# Patient Record
Sex: Female | Born: 1975 | Hispanic: Yes | Smoking: Never smoker
Health system: Southern US, Community
[De-identification: ages and names within clinical notes are randomized; demographics above are authoritative.]

---

## 2004-12-22 ENCOUNTER — Emergency Department: Payer: Self-pay | Admitting: Emergency Medicine

## 2005-04-08 ENCOUNTER — Ambulatory Visit: Payer: Self-pay

## 2005-09-27 ENCOUNTER — Emergency Department: Payer: Self-pay | Admitting: Emergency Medicine

## 2006-02-05 ENCOUNTER — Emergency Department: Payer: Self-pay | Admitting: General Practice

## 2006-02-06 ENCOUNTER — Ambulatory Visit: Payer: Self-pay | Admitting: General Practice

## 2006-02-06 ENCOUNTER — Observation Stay: Payer: Self-pay | Admitting: Surgery

## 2006-02-11 ENCOUNTER — Emergency Department: Payer: Self-pay | Admitting: Emergency Medicine

## 2007-06-02 IMAGING — US ABDOMEN ULTRASOUND
1 series · 17 of 25 positions shown · non-contrast
Comparison: none

REASON FOR EXAM: RIGHT upper quadrant pain
COMMENTS:

[Series 1: abdomen ultrasound · 17 of 51 slices shown]
[im 1/51]
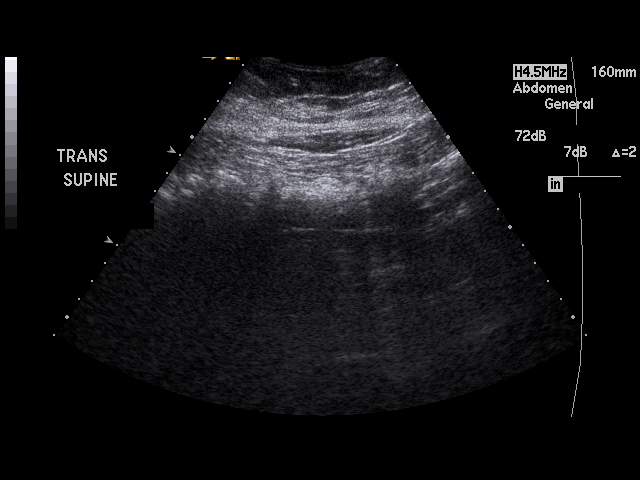
[im 5/51]
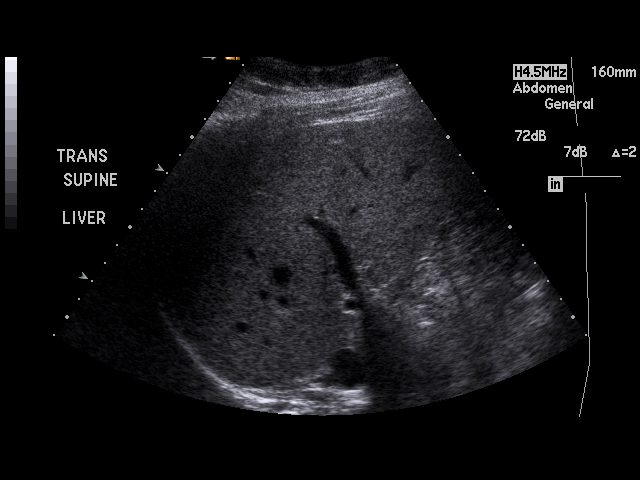
[im 7/51]
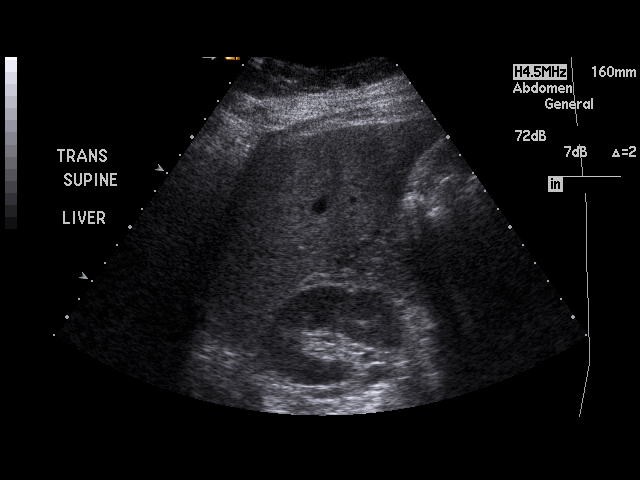
[im 11/51]
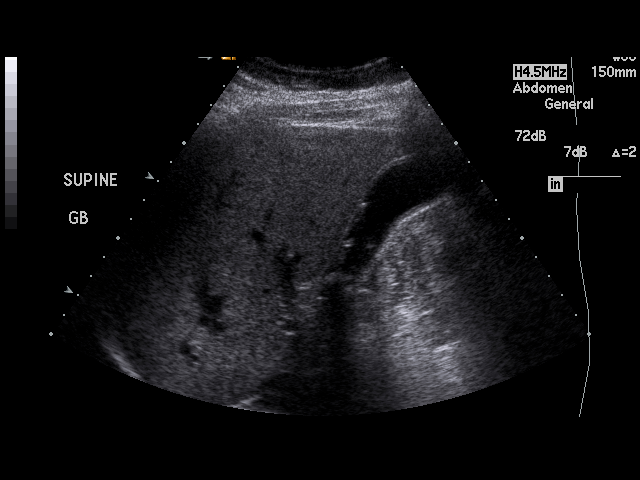
[im 13/51]
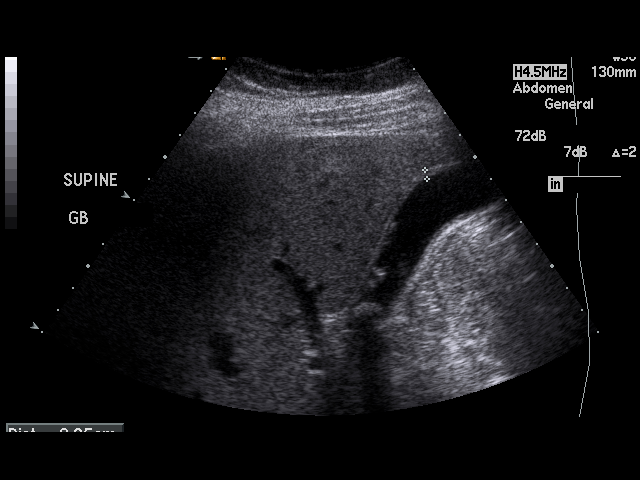
[im 17/51]
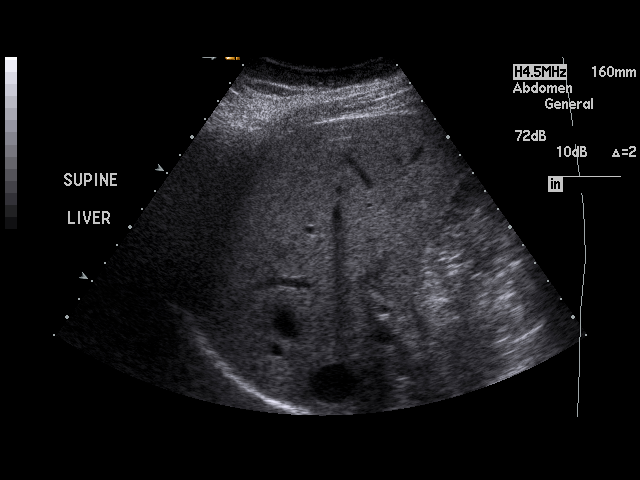
[im 19/51]
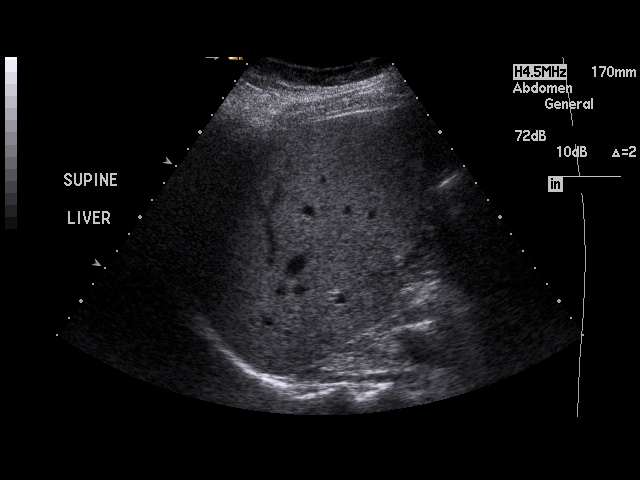
[im 23/51]
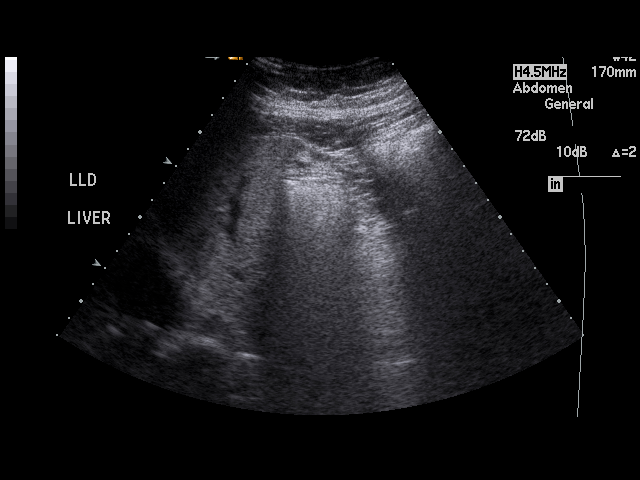
[im 26/51]
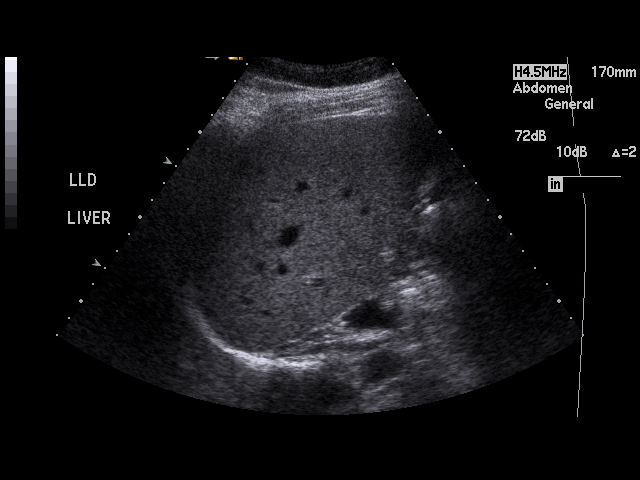
[im 28/51]
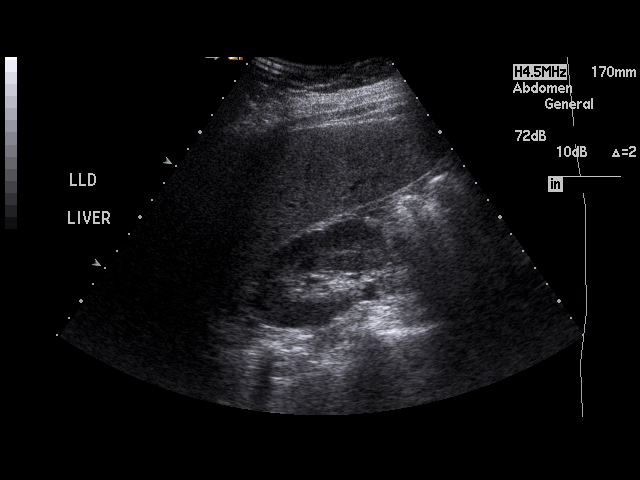
[im 32/51]
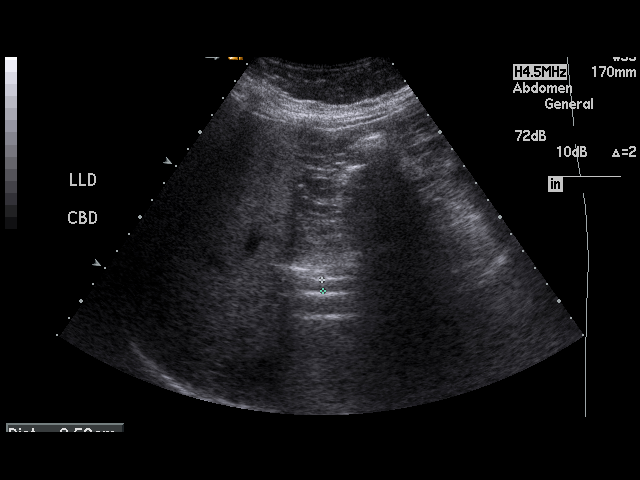
[im 34/51]
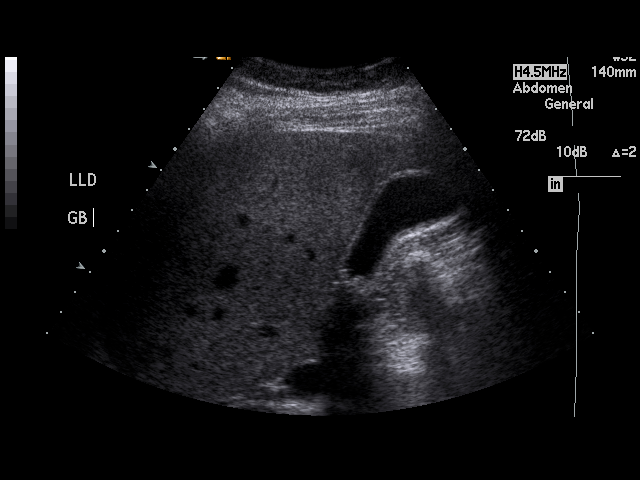
[im 38/51]
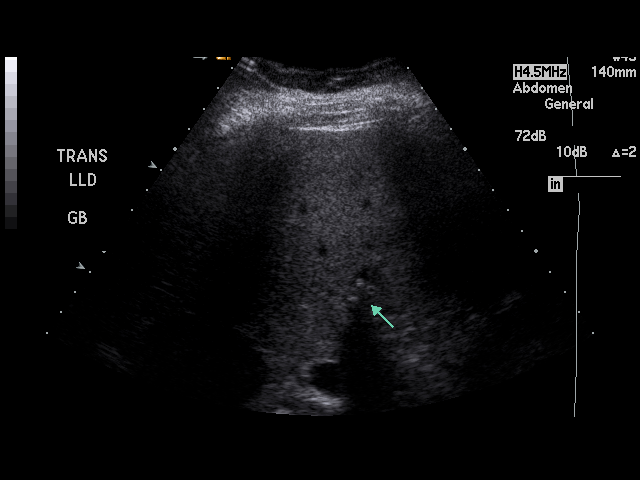
[im 40/51]
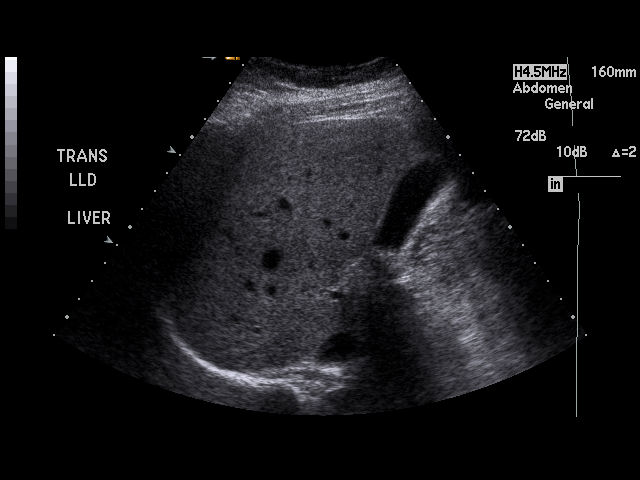
[im 44/51]
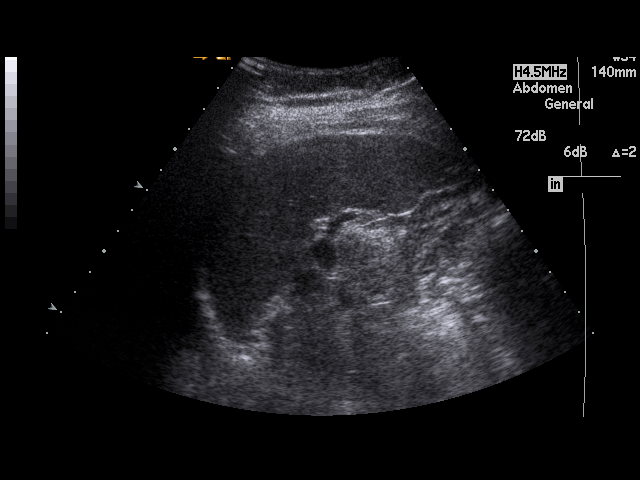
[im 46/51]
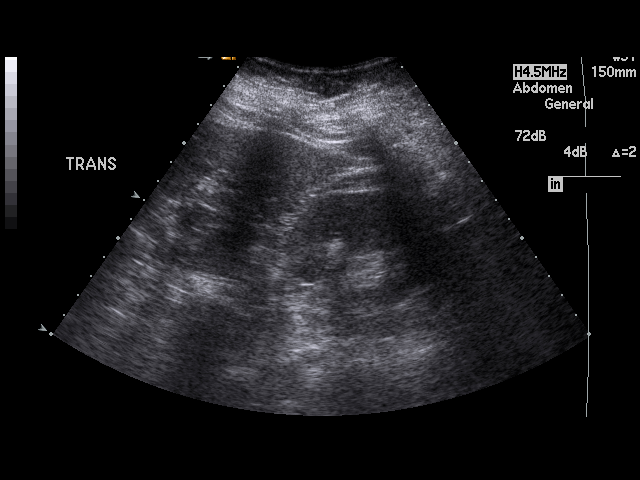
[im 51/51]
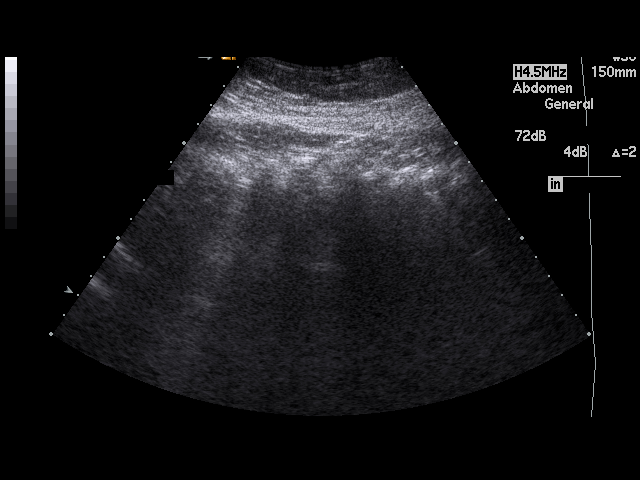

[17 of 25 positions shown; findings below may reference images not displayed]

PROCEDURE:     US  - US ABDOMEN GENERAL SURVEY  - February 06, 2006  [DATE]

RESULT:       Sonographic evaluation of the abdomen is performed.  The
pancreas could not be seen because of overlying bowel gas.  The liver,
aorta, spleen and kidneys appear to be normal.  Gallbladder contains
gallstones which appear to be mobile but there is a stone within the neck of
the gallbladder which did not move.  There is a positive sonographic Murphy
sign.  Gallbladder wall thickness is increased at 3.5 mm and the common bile
duct diameter is 6.4 mm.
IMPRESSION: Sonographic findings of cholelithiasis with what appears to be an impacted
stone in the neck of the gallbladder and possible changes of cholecystitis.

## 2010-02-19 ENCOUNTER — Emergency Department: Payer: Self-pay | Admitting: Emergency Medicine

## 2013-07-26 ENCOUNTER — Emergency Department: Payer: Self-pay | Admitting: Emergency Medicine

## 2013-11-21 ENCOUNTER — Emergency Department: Payer: Self-pay | Admitting: Emergency Medicine

## 2013-11-22 LAB — COMPREHENSIVE METABOLIC PANEL
Albumin: 4 g/dL (ref 3.4–5.0)
Alkaline Phosphatase: 76 U/L
Anion Gap: 4 — ABNORMAL LOW (ref 7–16)
BUN: 11 mg/dL (ref 7–18)
Bilirubin,Total: 0.3 mg/dL (ref 0.2–1.0)
Calcium, Total: 9.1 mg/dL (ref 8.5–10.1)
Chloride: 101 mmol/L (ref 98–107)
Co2: 31 mmol/L (ref 21–32)
Creatinine: 0.57 mg/dL — ABNORMAL LOW (ref 0.60–1.30)
EGFR (African American): >60
EGFR (Non-African Amer.): >60
Glucose: 89 mg/dL (ref 65–99)
Osmolality: 271 (ref 275–301)
Potassium: 3.1 mmol/L — ABNORMAL LOW (ref 3.5–5.1)
SGOT(AST): 10 U/L — ABNORMAL LOW (ref 15–37)
SGPT (ALT): 21 U/L (ref 12–78)
Sodium: 136 mmol/L (ref 136–145)
Total Protein: 8.6 g/dL — ABNORMAL HIGH (ref 6.4–8.2)

## 2013-11-22 LAB — CBC
HCT: 37.5 % (ref 35.0–47.0)
HGB: 12.6 g/dL (ref 12.0–16.0)
MCH: 28.7 pg (ref 26.0–34.0)
MCHC: 33.6 g/dL (ref 32.0–36.0)
MCV: 85 fL (ref 80–100)
Platelet: 291 10*3/uL (ref 150–440)
RBC: 4.41 10*6/uL (ref 3.80–5.20)
RDW: 14 % (ref 11.5–14.5)
WBC: 7.6 10*3/uL (ref 3.6–11.0)

## 2013-11-22 LAB — TROPONIN I: Troponin-I: <0.02 ng/mL

## 2013-11-22 LAB — MAGNESIUM: Magnesium: 1.8 mg/dL

## 2013-11-22 LAB — LIPASE, BLOOD: Lipase: 133 U/L (ref 73–393)

## 2013-11-22 LAB — TSH: Thyroid Stimulating Horm: 5.81 u[IU]/mL — ABNORMAL HIGH

## 2014-11-19 IMAGING — CR RIGHT ELBOW - COMPLETE 3+ VIEW
1 series · 4 of 4 positions shown · non-contrast
Comparison: No priors.

CLINICAL DATA: History of fall complaining right elbow pain.

EXAM:
RIGHT ELBOW - COMPLETE 3+ VIEW

[Series 1: x elbow lat right · 0.14mm/px · 4 of 4 slices shown]
[im 1/4]
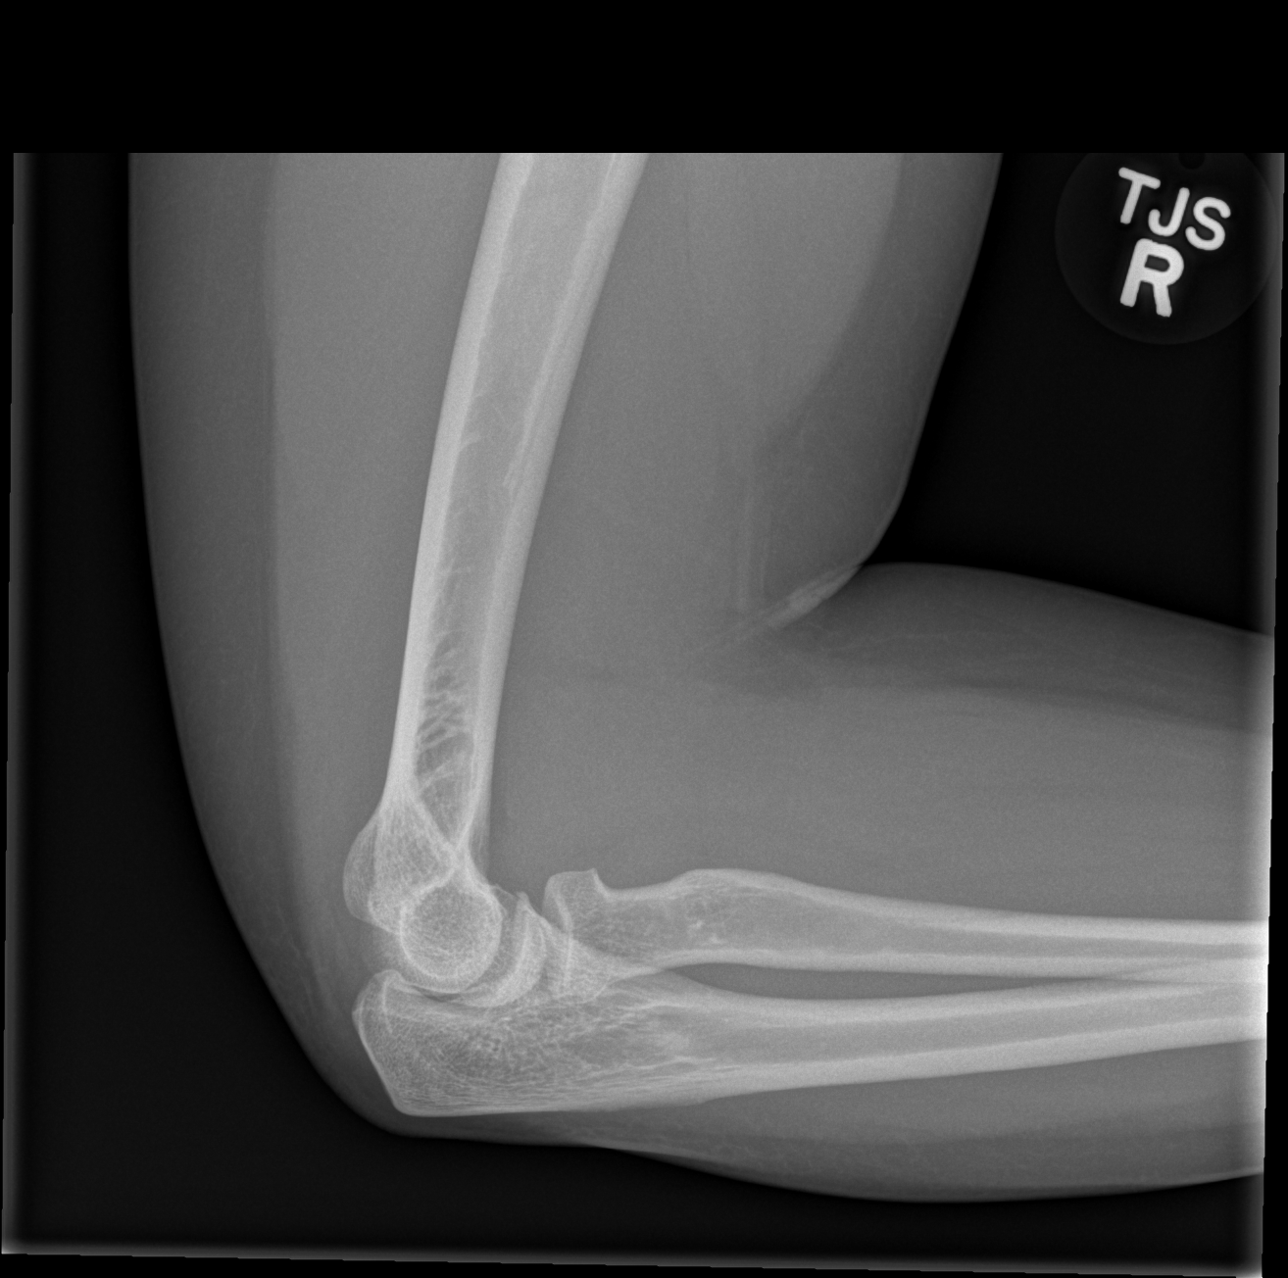
[im 2/4]
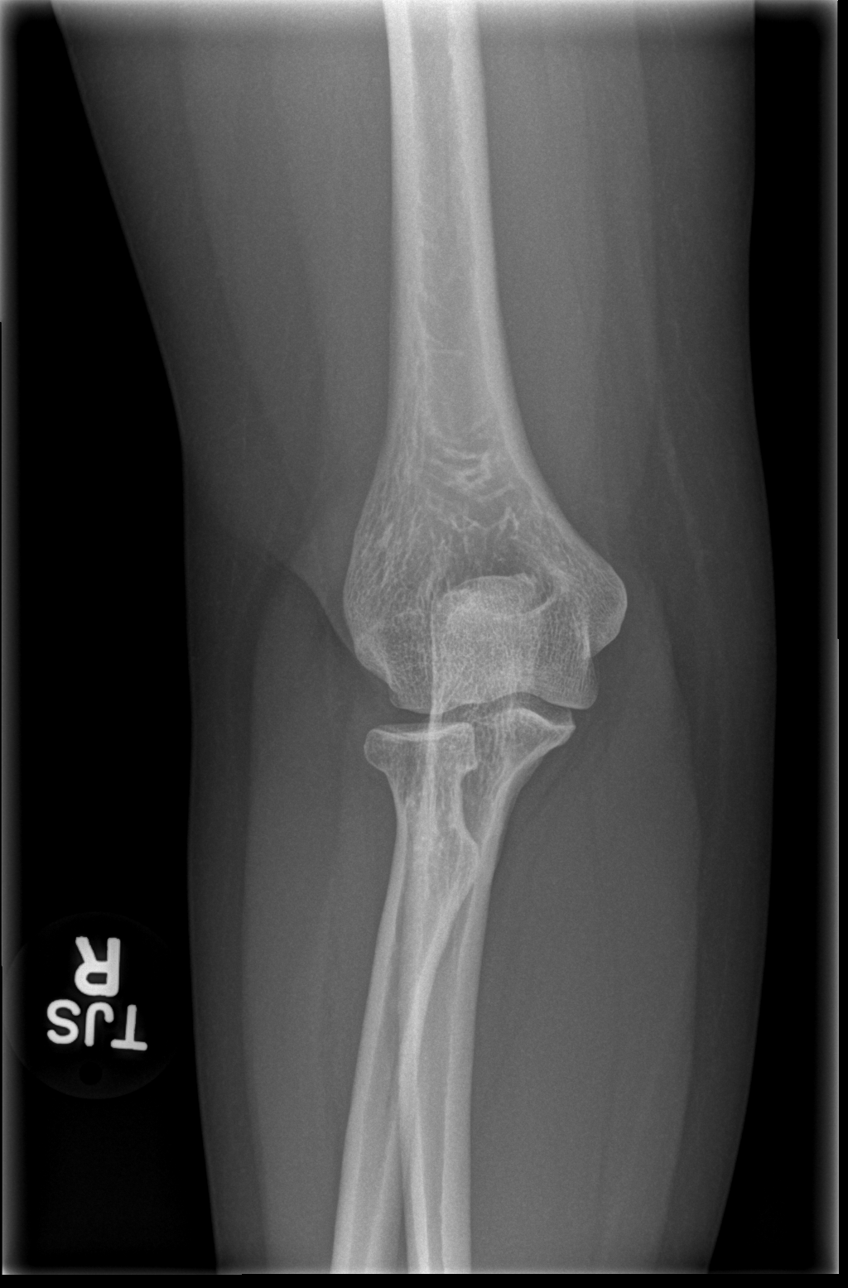
[im 3/4]
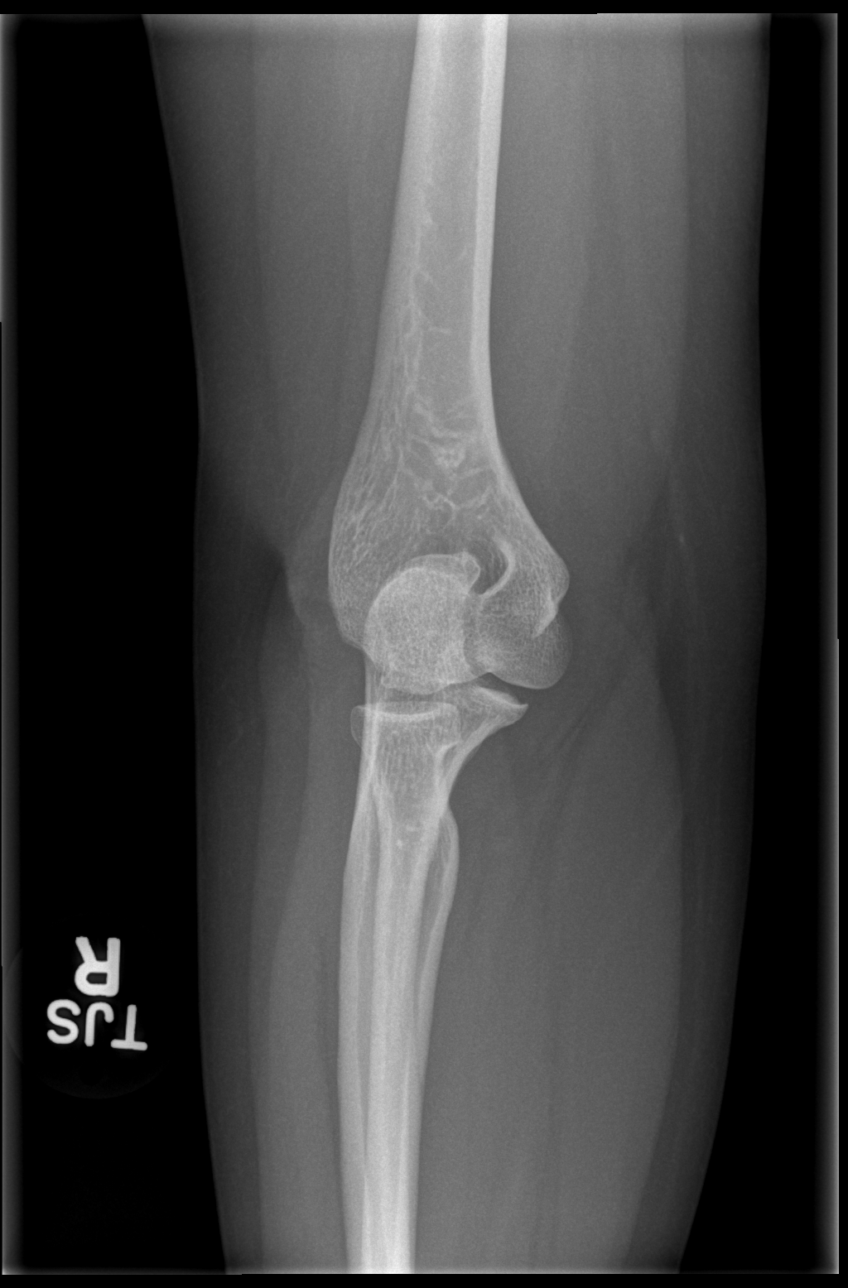
[im 4/4]
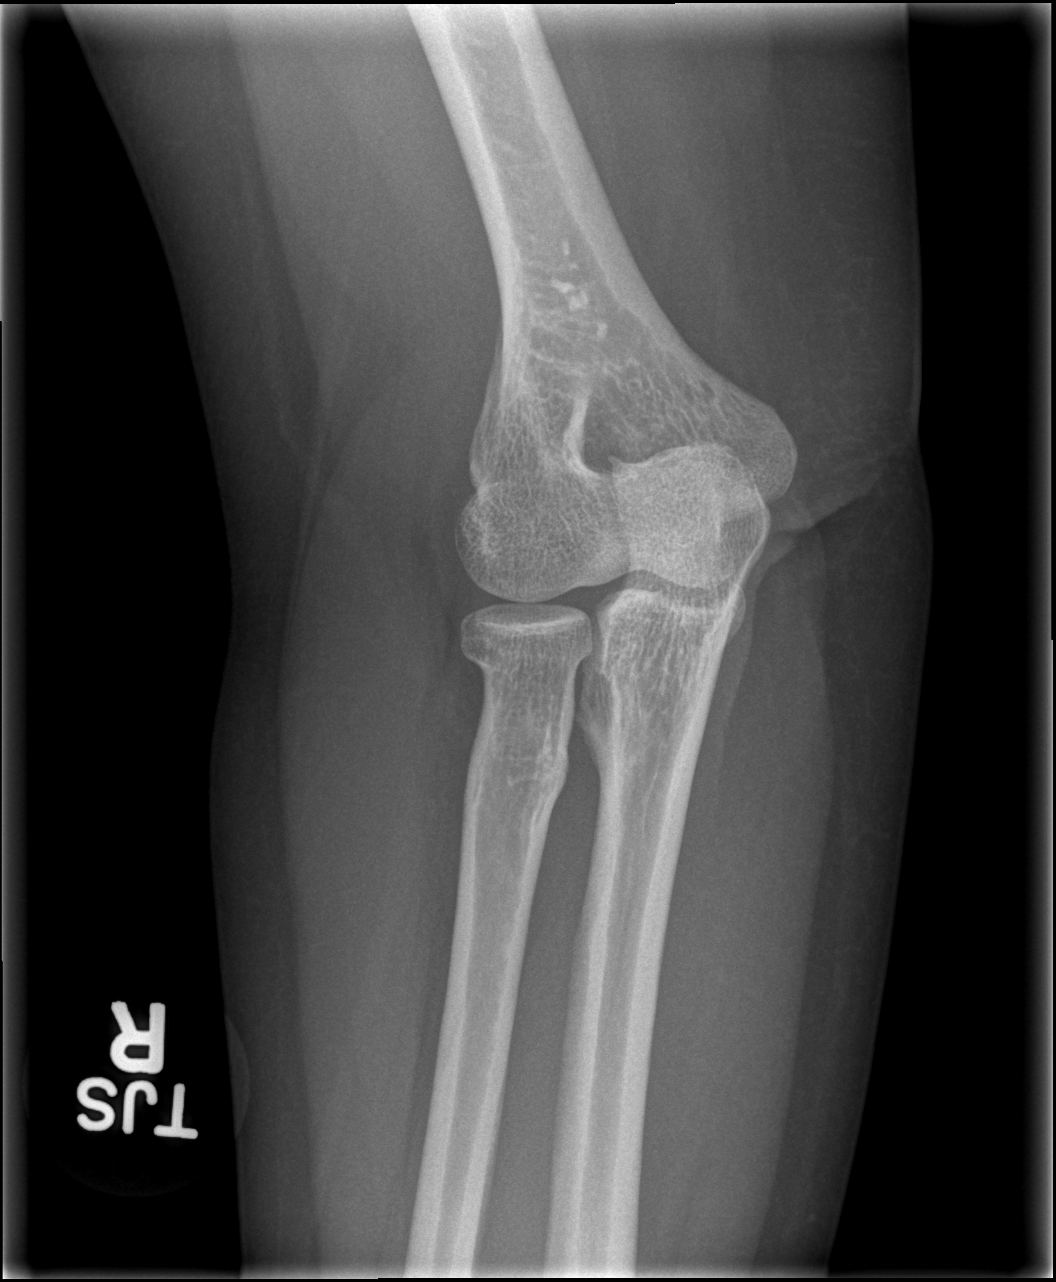

[4 of 4 positions shown; findings below may reference images not displayed]

FINDINGS: Four views of the right elbow demonstrate no acute displaced
fracture, subluxation, dislocation, joint or soft tissue
abnormality.
IMPRESSION: 1. Acute radiographic abnormality of the right elbow.

## 2015-06-19 ENCOUNTER — Emergency Department
Admission: EM | Admit: 2015-06-19 | Discharge: 2015-06-20 | Disposition: A | Discharge status: Home / Self Care | Payer: Self-pay | Attending: Emergency Medicine | Admitting: Emergency Medicine

## 2015-06-19 ENCOUNTER — Encounter: Payer: Self-pay | Admitting: Emergency Medicine

## 2015-06-19 DIAGNOSIS — J189 Pneumonia, unspecified organism: Secondary | ICD-10-CM

## 2015-06-19 DIAGNOSIS — J159 Unspecified bacterial pneumonia: Secondary | ICD-10-CM | POA: Insufficient documentation

## 2015-06-19 DIAGNOSIS — Z880 Allergy status to penicillin: Secondary | ICD-10-CM | POA: Insufficient documentation

## 2015-06-19 LAB — BASIC METABOLIC PANEL
Anion gap: 10 (ref 5–15)
BUN: 7 mg/dL (ref 6–20)
CO2: 23 mmol/L (ref 22–32)
Calcium: 9.3 mg/dL (ref 8.9–10.3)
Chloride: 100 mmol/L — ABNORMAL LOW (ref 101–111)
Creatinine, Ser: 0.64 mg/dL (ref 0.44–1.00)
GFR calc Af Amer: >60 mL/min (ref >60)
GFR calc non Af Amer: >60 mL/min (ref >60)
Glucose, Bld: 108 mg/dL — ABNORMAL HIGH (ref 65–99)
Potassium: 3.7 mmol/L (ref 3.5–5.1)
Sodium: 133 mmol/L — ABNORMAL LOW (ref 135–145)

## 2015-06-19 LAB — CBC
HCT: 35.1 % (ref 35.0–47.0)
Hemoglobin: 11.6 g/dL — ABNORMAL LOW (ref 12.0–16.0)
MCH: 25.9 pg — ABNORMAL LOW (ref 26.0–34.0)
MCHC: 33 g/dL (ref 32.0–36.0)
MCV: 78.5 fL — ABNORMAL LOW (ref 80.0–100.0)
Platelets: 261 10*3/uL (ref 150–440)
RBC: 4.47 MIL/uL (ref 3.80–5.20)
RDW: 15 % — ABNORMAL HIGH (ref 11.5–14.5)
WBC: 6.7 10*3/uL (ref 3.6–11.0)

## 2015-06-19 MED ORDER — ACETAMINOPHEN 500 MG PO TABS
1000.0000 mg | ORAL_TABLET | Freq: Once | ORAL | Status: AC
  Administered 2015-06-19: 1000 mg via ORAL
  Filled 2015-06-19: qty 2

## 2015-06-19 MED ORDER — SODIUM CHLORIDE 0.9 % IV BOLUS (SEPSIS)
1000.0000 mL | Freq: Once | INTRAVENOUS | Status: AC
Start: 2015-06-19 — End: 2015-06-20
  Administered 2015-06-19: 1000 mL via INTRAVENOUS

## 2015-06-19 NOTE — ED Notes (Signed)
Pt to triage via w/c with no distress noted, wrapped up in blankets; pt reports fever, body aches and nonprod cough today; st recent cold symptoms; tylenol taken at 215pm; pt instructed not to bundle up with fever and blankets removed

## 2015-06-20 ENCOUNTER — Emergency Department: Payer: Self-pay

## 2015-06-20 ENCOUNTER — Other Ambulatory Visit: Payer: Self-pay

## 2015-06-20 MED ORDER — LEVOFLOXACIN IN D5W 750 MG/150ML IV SOLN
750.0000 mg | Freq: Once | INTRAVENOUS | Status: AC
  Administered 2015-06-20: 750 mg via INTRAVENOUS
  Filled 2015-06-20: qty 150

## 2015-06-20 MED ORDER — SODIUM CHLORIDE 0.9 % IV BOLUS (SEPSIS)
1000.0000 mL | Freq: Once | INTRAVENOUS | Status: AC
  Administered 2015-06-20: 1000 mL via INTRAVENOUS

## 2015-06-20 MED ORDER — LEVOFLOXACIN 750 MG PO TABS: 750.0000 mg | ORAL_TABLET | Freq: Every day | ORAL | Status: AC

## 2015-06-20 NOTE — Discharge Instructions (Signed)
Neumona (Pneumonia) La neumona es una infeccin en los pulmones.  CAUSAS La neumona puede estar causada por una bacteria o un virus. Generalmente, estas infecciones estn causadas por la aspiracin de partculas infecciosas que ingresan a los pulmones (vas respiratorias). SIGNOS Y SNTOMAS   Tos.  Fiebre.  Dolor en el pecho.  Frecuencia respiratoria aumentada.  Sibilancias.  Produccin de mucosidad. DIAGNSTICO  Si presenta los sntomas comunes de la neumona, el mdico normalmente confirmar el diagnstico con una radiografa de trax. Si tiene neumona, la radiografa mostrar una anomala en el pulmn (infiltrados pulmonares). Podrn realizarse otras pruebas de sangre, orina o esputo para encontrar la causa especfica de su neumona. El mdico tambin puede hacer pruebas (como gases en sangre o una oximetra de pulso) para verificar el correcto funcionamiento de los pulmones. TRATAMIENTO  Algunos tipos de neumona pueden contagiarse a otras personas al toser o estornudar. Es posible que le pidan que utilice una mscara antes y durante el examen. Si la neumona est causada por una bacteria, puede tratarse con medicamentos antibiticos. Si la neumona es causada por el virus de la gripe, puede tratarse con medicamentos antivirales. La mayora de las dems infecciones virales deben seguir su curso. Estas infecciones no respondern a los antibiticos.  INSTRUCCIONES PARA EL CUIDADO EN EL HOGAR   Los inhibidores de la tos pueden utilizarse si no descansa bien. Sin embargo, la tos, al limpiar los pulmones, brinda una proteccin. Debe evitar, en lo posible, utilizar medicamentos para detener la tos.  Es posible que el mdico le haya recetado medicamentos si cree que la causa de su neumona es una bacteria o gripe. Finalice los medicamentos, aunque comience a sentirse mejor.  El mdico tambin puede haberle recetado un expectorante. Este afloja la mucosidad, para poder eliminarla con la  tos.  Tome los medicamentos solamente como se lo haya indicado el mdico.  No fume. El fumar es una de las causas ms frecuentes de bronquitis y puede contribuir a la neumona. Si es fumador y contina hacindolo, la tos puede durar varias semanas despus de que la neumona haya desaparecido.  Un vaporizador o humidificador con vapor fro en la habitacin o en la casa puede ayudar a aflojar la mucosidad.  La tos generalmente empeora por la noche. Duerma semisentado en una reposera o use un par de almohadas debajo de la cabeza.  Haga reposo todo el tiempo que lo necesite. El organismo por lo general le har saber si tiene ganas de descansar. PREVENCIN La vacuna antineumocccica est disponible para prevenir la neumona bacteriana comn. Habitualmente, se recomienda para:  Personas mayores de 65 aos.  Pacientes que estn en tratamiento de quimioterapia.  Personas con trastornos pulmonares crnicos, como bronquitis o enfisema.  Personas con problemas del sistema inmunolgico. Si usted es mayor de 65 aos o tiene un trastorno que lo pone en situacin de alto riesgo, es posible que reciba una vacuna antineumoccica, si todava no la tiene. En algunos pases, tambin se recomienda la aplicacin de rutina de la vacuna contra la gripe. Esta vacuna puede ayudar a prevenir algunos casos de neumona. Es posible que le ofrezcan aplicarse la vacuna contra la gripe como parte del tratamiento. Si fuma, es el momento de abandonar el hbito. Puede recibir instrucciones acerca de cmo dejar de fumar. El mdico puede darle medicamentos y asesoramiento para ayudarlo. SOLICITE ATENCIN MDICA SI: Tiene fiebre. SOLICITE ATENCIN MDICA DE INMEDIATO SI:   La enfermedad empeora. Esto vale especialmente en el caso de que usted sea una persona   mayor o se encuentre dbil por otra enfermedad.  No puede controlar la tos con antitusivos y no puede dormir debido a ello.  Comienza a escupir sangre al toser.  El  dolor empeora o no puede controlarlo con los medicamentos.  Alguno de los sntomas que inicialmente lo llevaron a la consulta empeora en vez de mejorar.  Siente falta de aire o dolor en el pecho. ASEGRESE DE QUE:   Comprende estas instrucciones.  Controlar su afeccin.  Recibir ayuda de inmediato si no mejora o si empeora. Document Released: 06/19/2005 Document Revised: 01/24/2014 [employer] Patient Information 2015 [employer]. This information is not intended to replace advice given to you by your health care provider. Make sure you discuss any questions you have with your health care provider.  

## 2015-06-20 NOTE — ED Notes (Signed)
Patient resting quietly. Family at bedside. Patient on monitor.

## 2015-06-20 NOTE — ED Notes (Signed)
Patient with no complaints at this time. Respirations even and unlabored. Skin warm/dry. Discharge instructions reviewed with patient at this time. Patient given opportunity to voice concerns/ask questions. IV removed per policy and band-aid applied to site. Patient discharged at this time and left Emergency Department with steady gait.  

## 2015-06-20 NOTE — ED Provider Notes (Signed)
[facility] Emergency Department Provider Note  ____________________________________________  Time seen: Approximately 2346 PM  I have reviewed the triage vital signs and the nursing notes.   HISTORY  Chief Complaint Fever; Cough; and Generalized Body Aches    HPI Brittany Nash is a 39 y.o. female who comes in with fever, chest pain and headache. The patient reports the symptoms started yesterday. The patient reports that she had a high fever but she did not check her temperature. The patient has been taking Tylenol. She has had a cough with no vomiting nausea or abdominal pain. She has had some chills and muscle aches with no breathing difficulty. The patient also decides any sick contacts. The patient reports that she continued to feel unwell today so she decided to come in for further evaluation and treatment. Her pain is in the middle of her chest into the right side of her chest.The patient reports that she does not have any pain right now her pain is improved.   History reviewed. No pertinent past medical history.  There are no active problems to display for this patient.   Past surgical history C-section  Current Outpatient Rx  Name  Route  Sig  Dispense  Refill  . levofloxacin (LEVAQUIN) 750 MG tablet   Oral   Take 1 tablet (750 mg total) by mouth daily.   10 tablet   0     Allergies Penicillins  No family history on file.  Social History Social History  Substance Use Topics  . Smoking status: Never Smoker   . Smokeless tobacco: None  . Alcohol Use: No    Review of Systems Constitutional: fever/chills Eyes: No visual changes. ENT: No sore throat. Cardiovascular:  chest pain. Respiratory: Cough, Denies shortness of breath. Gastrointestinal: No abdominal pain.  No nausea, no vomiting.  No diarrhea.  No constipation. Genitourinary: Negative for dysuria. Musculoskeletal: Negative for back pain. Skin: Negative for  rash. Neurological: Negative for headaches, focal weakness or numbness.  10-point ROS otherwise negative.  ____________________________________________   PHYSICAL EXAM:  VITAL SIGNS: ED Triage Vitals  Enc Vitals Group     BP 06/19/15 2138 110/67 mmHg     Pulse Rate 06/19/15 2138 110     Resp 06/19/15 2138 20     Temp 06/19/15 2138 102.8 F (39.3 C)     Temp Source 06/19/15 2138 Oral     SpO2 06/19/15 2138 99 %     Weight 06/19/15 2138 140 lb (63.504 kg)     Height --      Head Cir --      Peak Flow --      Pain Score --      Pain Loc --      Pain Edu? --      Excl. in GC? --     Constitutional: Alert and oriented. Well appearing and in mild distress. Eyes: Conjunctivae are normal. PERRL. EOMI. Head: Atraumatic. Nose: No congestion/rhinnorhea. Mouth/Throat: Mucous membranes are moist.  Oropharynx non-erythematous. Cardiovascular: Normal rate, regular rhythm. Grossly normal heart sounds.  Good peripheral circulation. Respiratory: Normal respiratory effort.  No retractions. Lungs CTAB. Gastrointestinal: Soft and nontender. No distention. Positive bowel sounds Musculoskeletal: No lower extremity tenderness nor edema.   Neurologic:  Normal speech and language.  Skin:  Skin is warm, dry and intact.  Psychiatric: Mood and affect are normal.   ____________________________________________   LABS (all labs ordered are listed, but only abnormal results are displayed)  Labs Reviewed  BASIC  METABOLIC PANEL - Abnormal; Notable for the following:    Sodium 133 (*)    Chloride 100 (*)    Glucose, Bld 108 (*)    All other components within normal limits  CBC - Abnormal; Notable for the following:    Hemoglobin 11.6 (*)    MCV 78.5 (*)    MCH 25.9 (*)    RDW 15.0 (*)    All other components within normal limits   ____________________________________________  EKG  ED ECG REPORT I, Rebecka Apley, the attending physician, personally viewed and interpreted this  ECG.   Date: 06/20/2015  EKG Time: 254  Rate: 112  Rhythm: sinus tachycardia  Axis: normal  Intervals:none  ST&T Change: none  ____________________________________________  RADIOLOGY  CXR: Right posterior lung consolidation/pneumonia  ____________________________________________   PROCEDURES  Procedure(s) performed: None  Critical Care performed: No  ____________________________________________   INITIAL IMPRESSION / ASSESSMENT AND PLAN / ED COURSE  Pertinent labs & imaging results that were available during my care of the patient were reviewed by me and considered in my medical decision making (see chart for details).  This is a 39 year old female who comes in today with fever cough chest pain and some mild headache. The patient did have a temperature to 102 when she arrived in the emergency department today. The patient does not have a significantly elevated white blood cell count but she does have pneumonia on chest x-ray. I gave the patient a dose of levofloxacin and a liter of normal saline. The patient will be reassessed once she's received her medication.  The patient also received of NS the patient reports that she feels much better. She will be discharged to home to follow up with her primary care physician or [facility] acute care clinic. The patient's tachycardia is improved. ____________________________________________   FINAL CLINICAL IMPRESSION(S) / ED DIAGNOSES  Final diagnoses:  Community acquired pneumonia      Rebecka Apley, MD 06/20/15 [phone]

## 2015-08-04 ENCOUNTER — Emergency Department: Payer: Worker's Compensation

## 2015-08-04 ENCOUNTER — Encounter: Payer: Self-pay | Admitting: *Deleted

## 2015-08-04 ENCOUNTER — Emergency Department
Admission: EM | Admit: 2015-08-04 | Discharge: 2015-08-04 | Disposition: A | Discharge status: Home / Self Care | Payer: Worker's Compensation | Attending: Emergency Medicine | Admitting: Emergency Medicine

## 2015-08-04 DIAGNOSIS — X58XXXA Exposure to other specified factors, initial encounter: Secondary | ICD-10-CM | POA: Diagnosis not present

## 2015-08-04 DIAGNOSIS — Y9289 Other specified places as the place of occurrence of the external cause: Secondary | ICD-10-CM | POA: Diagnosis not present

## 2015-08-04 DIAGNOSIS — S63502A Unspecified sprain of left wrist, initial encounter: Secondary | ICD-10-CM | POA: Insufficient documentation

## 2015-08-04 DIAGNOSIS — Y990 Civilian activity done for income or pay: Secondary | ICD-10-CM | POA: Insufficient documentation

## 2015-08-04 DIAGNOSIS — Z880 Allergy status to penicillin: Secondary | ICD-10-CM | POA: Insufficient documentation

## 2015-08-04 DIAGNOSIS — Y9389 Activity, other specified: Secondary | ICD-10-CM | POA: Insufficient documentation

## 2015-08-04 DIAGNOSIS — S6992XA Unspecified injury of left wrist, hand and finger(s), initial encounter: Secondary | ICD-10-CM | POA: Diagnosis present

## 2015-08-04 MED ORDER — IBUPROFEN 800 MG PO TABS
ORAL_TABLET | ORAL | Status: AC
  Filled 2015-08-04: qty 1

## 2015-08-04 MED ORDER — IBUPROFEN 400 MG PO TABS
600.0000 mg | ORAL_TABLET | Freq: Once | ORAL | Status: AC
  Administered 2015-08-04: 600 mg via ORAL

## 2015-08-04 MED ORDER — IBUPROFEN 200 MG PO TABS: 600.0000 mg | ORAL_TABLET | Freq: Four times a day (QID) | ORAL | Status: DC | PRN

## 2015-08-04 MED ORDER — IBUPROFEN 600 MG PO TABS
ORAL_TABLET | ORAL | Status: AC
  Administered 2015-08-04: 600 mg via ORAL
  Filled 2015-08-04: qty 1

## 2015-08-04 NOTE — ED Notes (Signed)
Pt reports that she was at work at 530pm today, sewing on a sewing machine when she felt something pop in her left wrist.  Continues to have pain.

## 2015-08-04 NOTE — ED Provider Notes (Addendum)
[facility] Emergency Department Provider Note  ____________________________________________   I have reviewed the triage vital signs and the nursing notes.   HISTORY  Chief Complaint Wrist Injury    HPI Brittany Nash is a 39 y.o. female was turning something while sewing at work today she felt a pop and pain in her left wrist. She is not lifting something heavy. The pain is mostly in the hyperthenar eminence post the wrist. There is no redness or swelling. No numbness no tingling. No other injury she can make a fist. She cannot remember something like this happening to her before. History is in Spanish via me as well as with interpreter. Patient denies pregnancy  History reviewed. No pertinent past medical history.  There are no active problems to display for this patient.   Past Surgical History  Procedure Laterality Date  . Cesarean section      x4    No current outpatient prescriptions on file.  Allergies Penicillins  History reviewed. No pertinent family history.  Social History Social History  Substance Use Topics  . Smoking status: Never Smoker   . Smokeless tobacco: None  . Alcohol Use: No    Review of Systems See history of present illness otherwise negative  ____________________________________________   PHYSICAL EXAM:  VITAL SIGNS: ED Triage Vitals  Enc Vitals Group     BP 08/04/15 1841 129/84 mmHg     Pulse Rate 08/04/15 1841 77     Resp 08/04/15 1841 18     Temp 08/04/15 1841 98.2 F (36.8 C)     Temp Source 08/04/15 1841 Oral     SpO2 08/04/15 1841 100 %     Weight 08/04/15 1841 145 lb (65.772 kg)     Height 08/04/15 1841 5\' 1"  (1.549 m)     Head Cir --      Peak Flow --      Pain Score 08/04/15 1839 8     Pain Loc --      Pain Edu? --      Excl. in GC? --     Constitutional: Alert and oriented. Well appearing and in no acute distress. Musculoskeletal: No lower extremity tenderness. No joint effusions, no  DVT signs strong distal pulses no edema Left upper extremity: There is no shoulder or elbow pain she can supinate and pronate with no difficulty, flexion and extension is intact in the elbow. Patient has intact flexion and extension in the wrist against resistance but it is uncomfortable to do so. She has pain to palpation in the proximal hyperthenar region with no evidence of erythema or swelling. There is no bony deformity noted she has strong distal pulses good cap refill compartments are soft and sensation is intact. She can make a fist and straighten out her fingers against resistance. Neurologic:  Normal speech and language. No gross focal neurologic deficits are appreciated.  Skin:  Skin is warm, dry and intact. No rash noted. Psychiatric: Mood and affect are normal. Speech and behavior are normal.  ____________________________________________   LABS (all labs ordered are listed, but only abnormal results are displayed)  Labs Reviewed - No data to display ____________________________________________  EKG  I personally interpreted any EKGs ordered by me or triage  ____________________________________________  RADIOLOGY  I reviewed any imaging ordered by me or triage that were performed during my shift ____________________________________________   PROCEDURES  Procedure(s) performed: Splint placement  Critical Care performed: None  ____________________________________________   INITIAL IMPRESSION / ASSESSMENT AND  PLAN / ED COURSE  Pertinent labs & imaging results that were available during my care of the patient were reviewed by me and considered in my medical decision making (see chart for details).  No evidence of acute bony pathology today, history is most consistent with a ligamentous injury. We'll place the patient in a splint and have her follow up with orthopedic surgery. I will give her a work note, we'll treat her with nonsteroidal pain medication, return  precautions are given and understood. Patient is neurovascularly intact with a negative x-ray.  Patient arrest with intact upper spelled placement which I personally have supervised ____________________________________________   FINAL CLINICAL IMPRESSION(S) / ED DIAGNOSES  Final diagnoses:  None     Jeanmarie Plant, MD 08/04/15 1935  Jeanmarie Plant, MD 08/04/15 [phone]

## 2016-10-13 IMAGING — CR DG CHEST 2V
1 series · 2 of 2 positions shown · non-contrast
Comparison: Chest radiograph November 22, 2013

CLINICAL DATA: Fever, body aches and nonproductive cough today.

EXAM:
CHEST  2 VIEW

[Series 1: dg chest 2 view · 0.14mm/px · 2 of 2 slices shown]
[im 1/2]
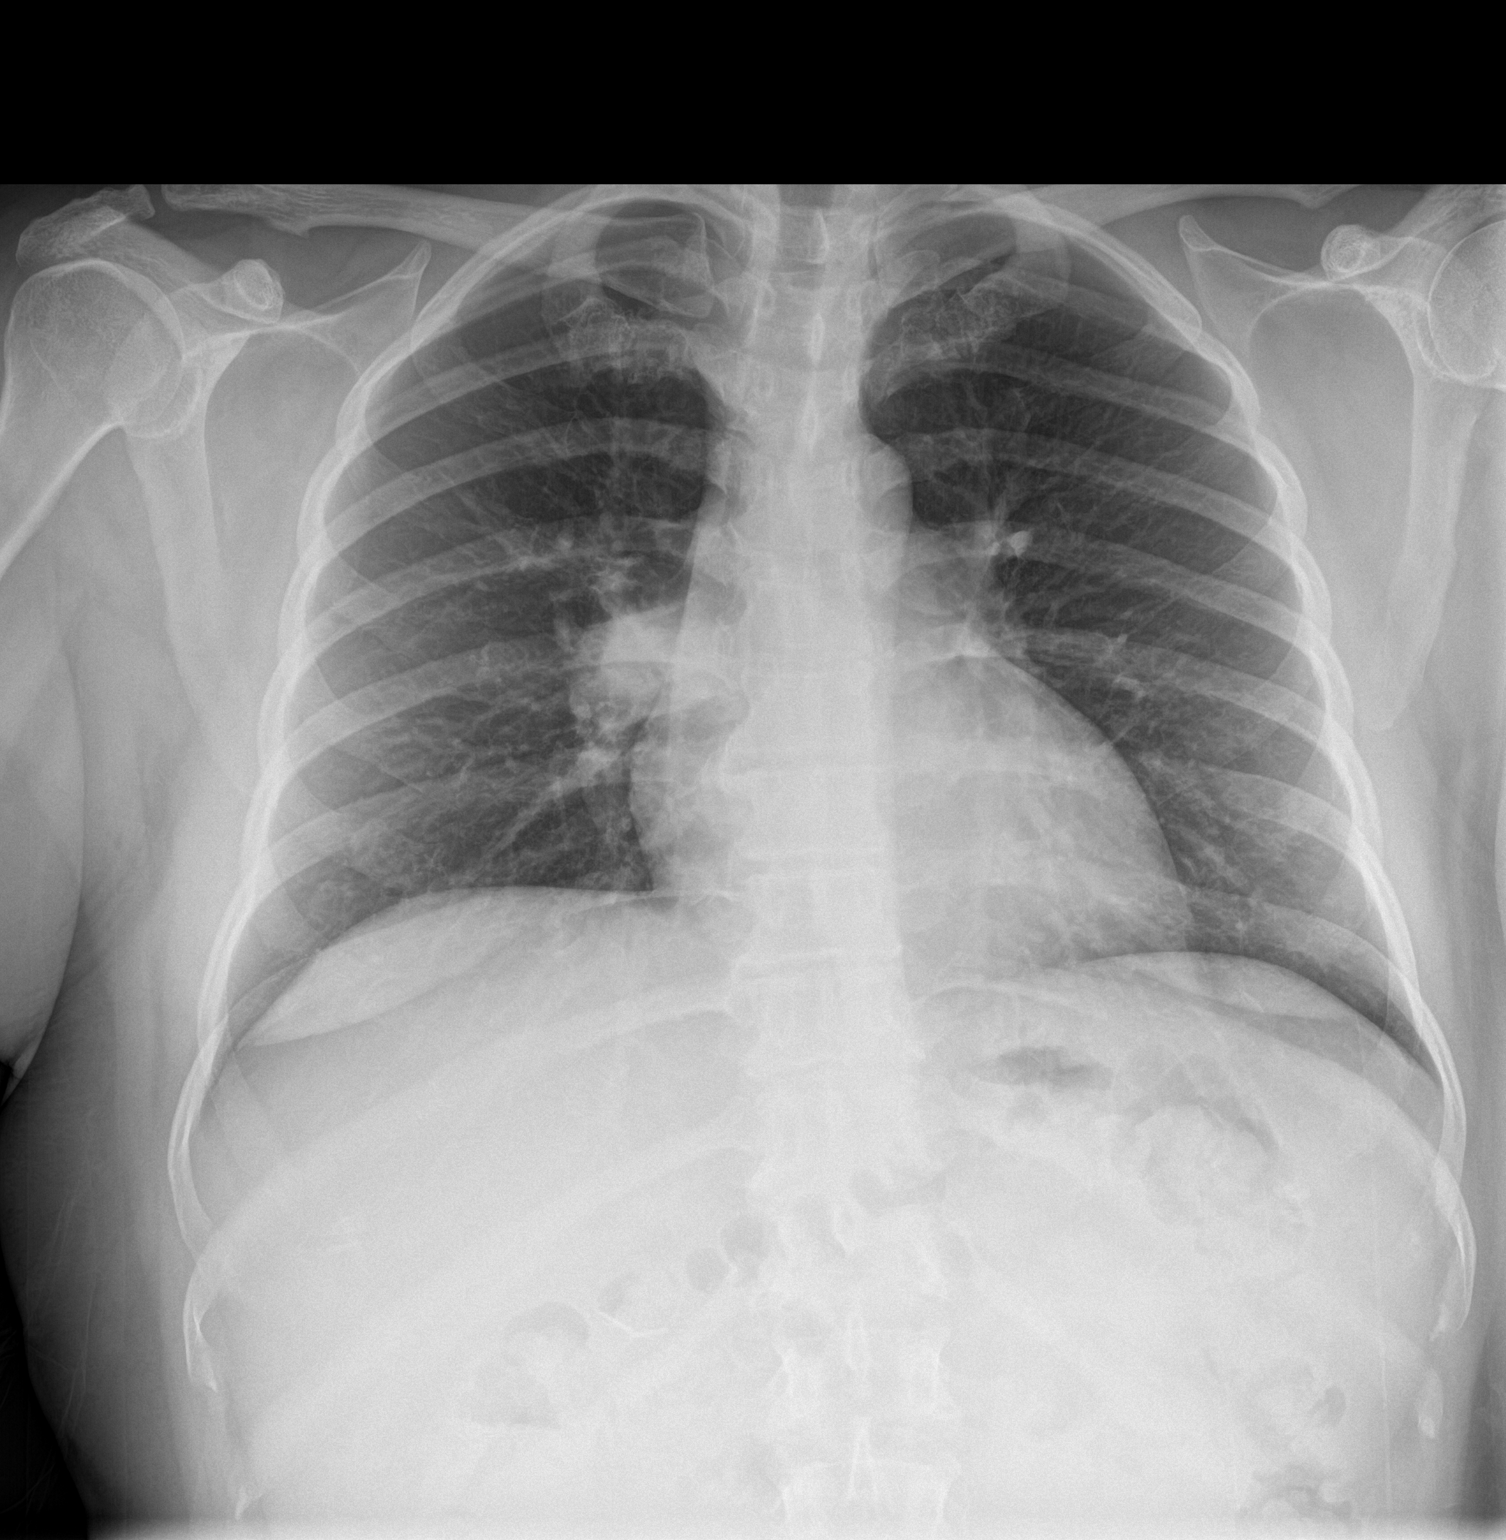
[im 2/2]
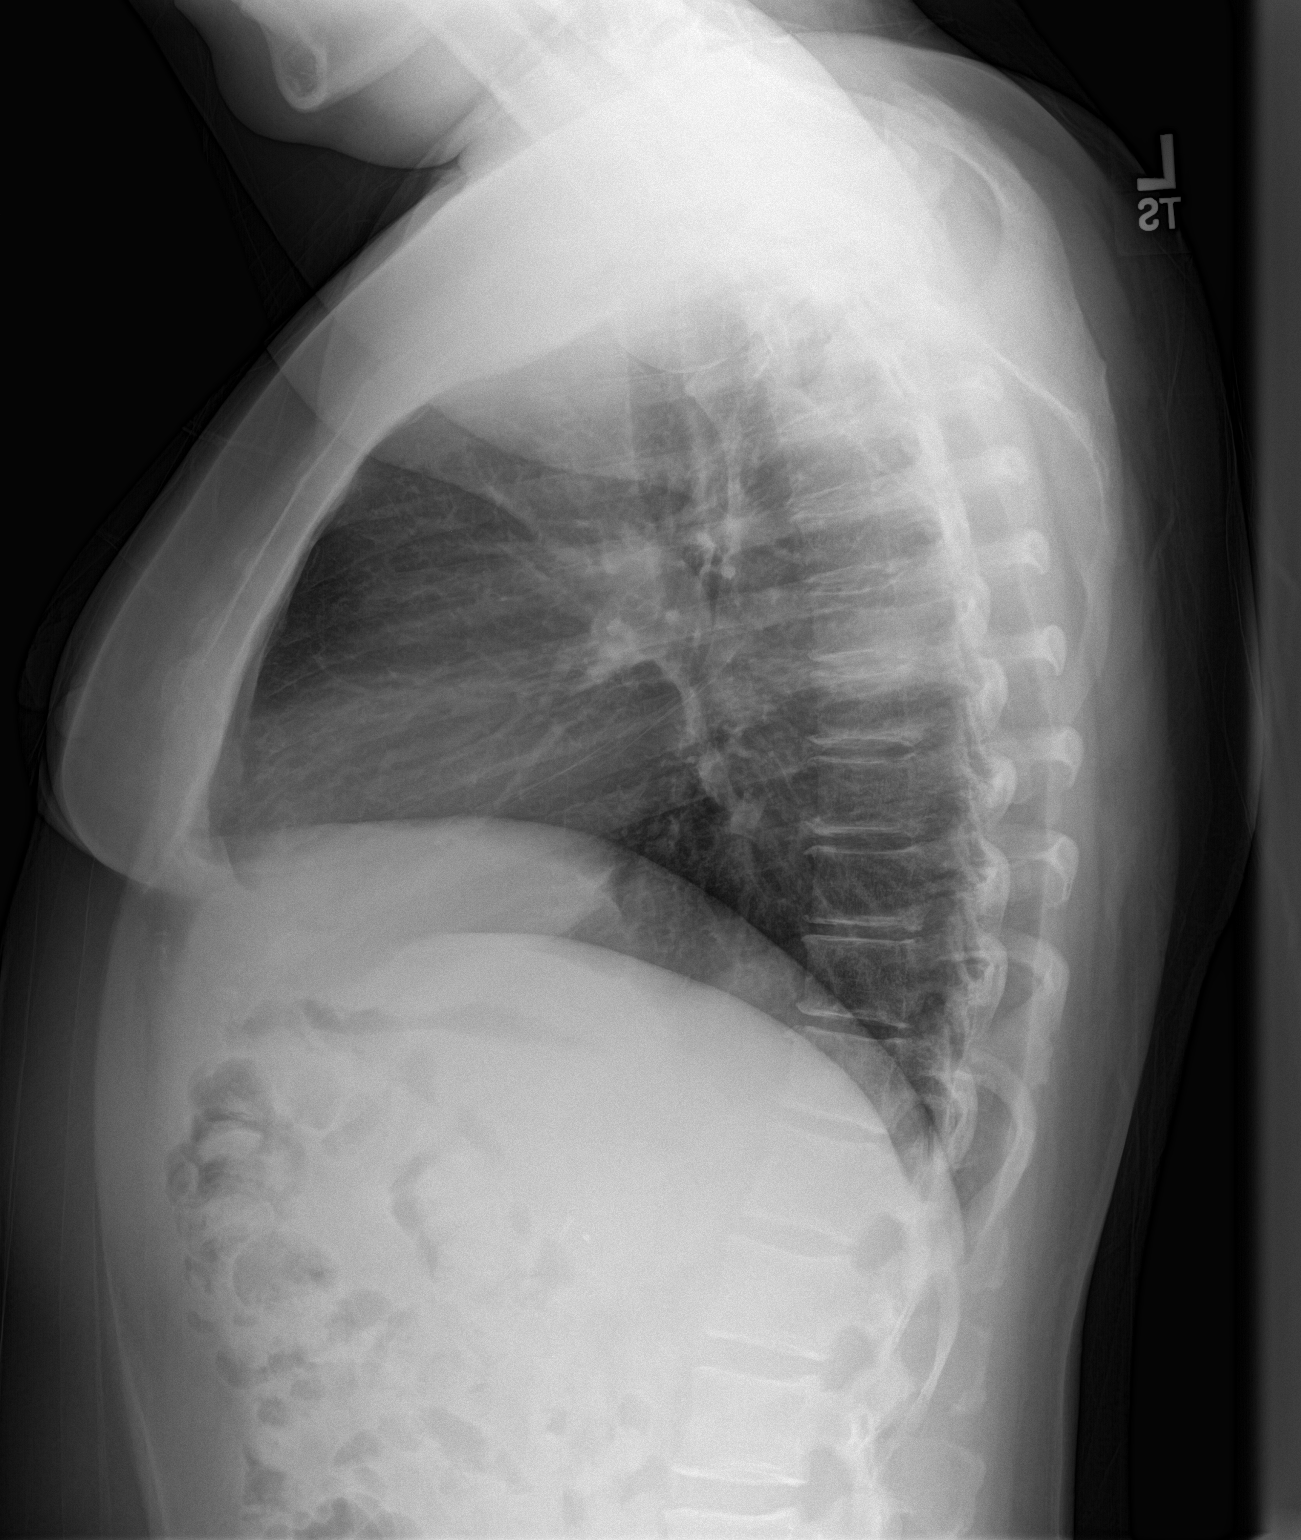

[2 of 2 positions shown; findings below may reference images not displayed]

FINDINGS: Airspace opacity projects in the RIGHT posterior lung at the level
of the hilum. No pleural effusion. No pneumothorax. Cardiac
silhouette is normal. Truck trachea projects midline and there is no
pneumothorax. Surgical clips in the included right abdomen
compatible with cholecystectomy. Soft tissue planes and included
osseous structure are nonsuspicious.
IMPRESSION: RIGHT posterior lung consolidation/pneumonia. Followup PA and
lateral chest X-ray is recommended in 3-4 weeks following trial of
antibiotic therapy to ensure resolution and exclude underlying
malignancy.

## 2016-11-27 IMAGING — CR DG WRIST COMPLETE 3+V*L*
1 series · 4 of 4 positions shown · non-contrast
Comparison: None.

CLINICAL DATA: reports that she was at work at 342pm today, sewing
on a sewing machine when she felt something pop in her left wrist.
Continues to have pain all around wrist and MC's

EXAM:
LEFT WRIST - COMPLETE 3+ VIEW

[Series 1: dg wrist complete left · 0.14mm/px · 4 of 4 slices shown]
[im 1/4]
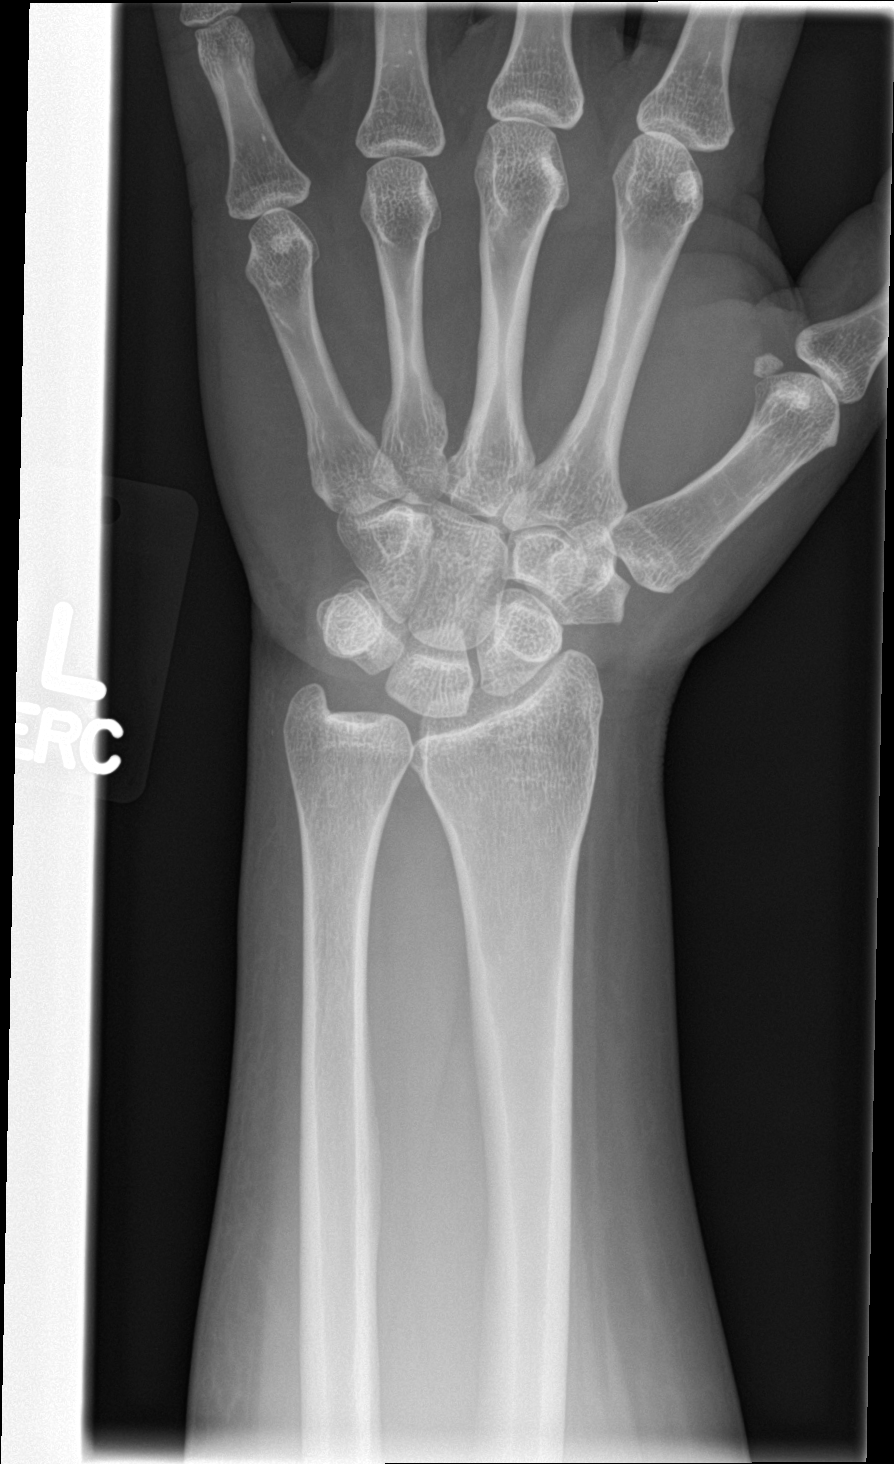
[im 2/4]
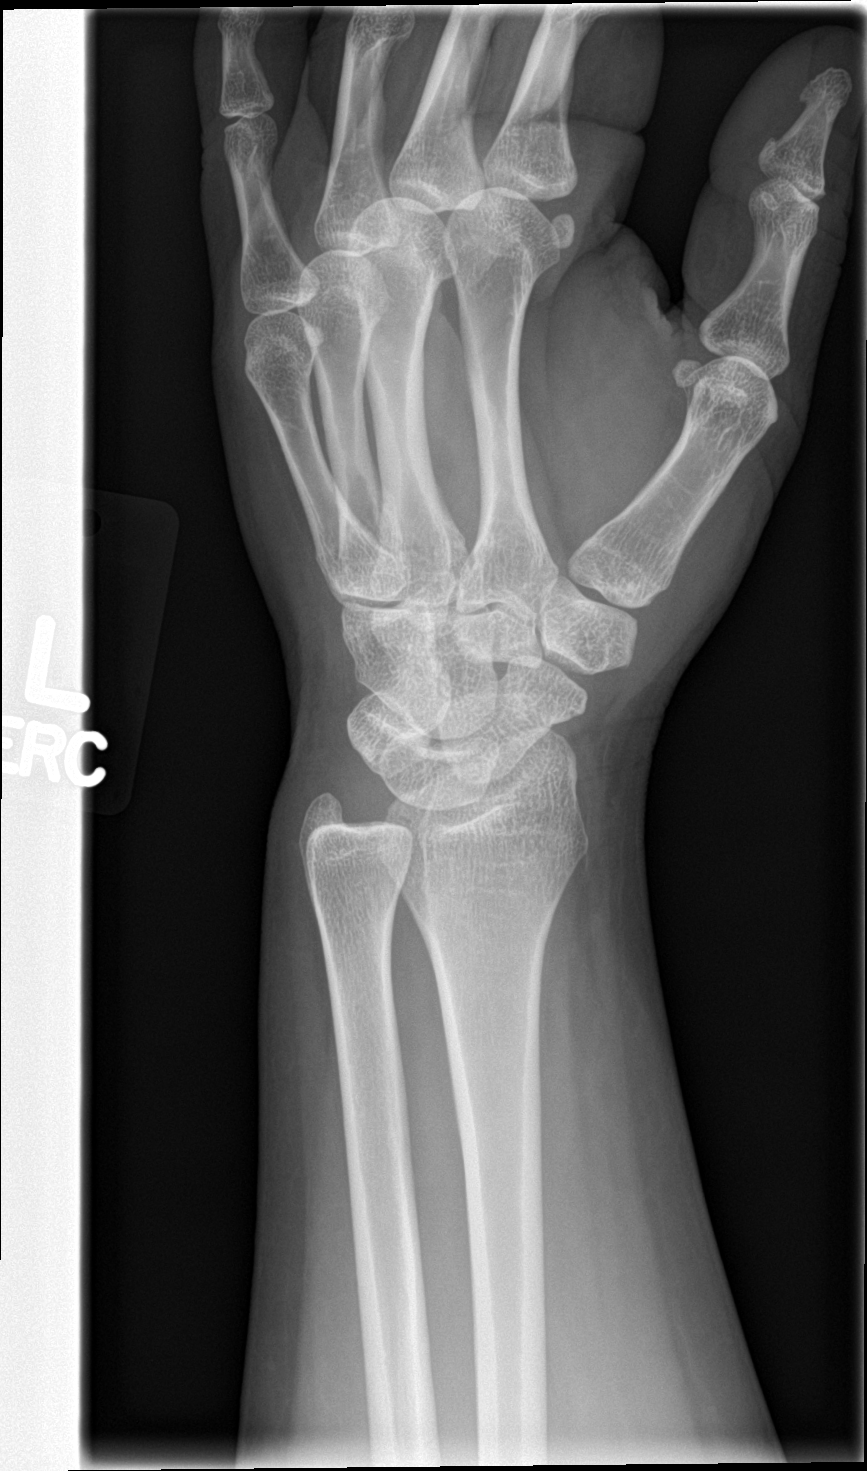
[im 3/4]
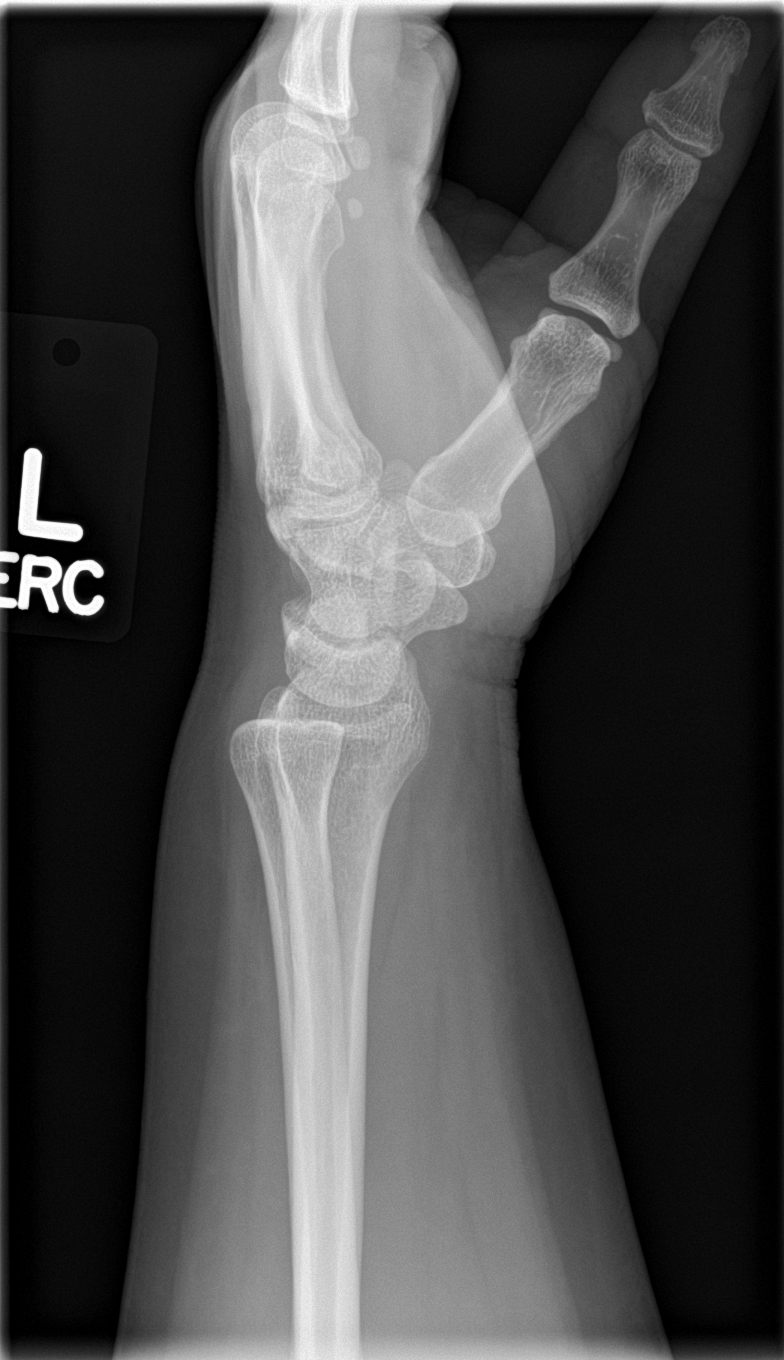
[im 4/4]
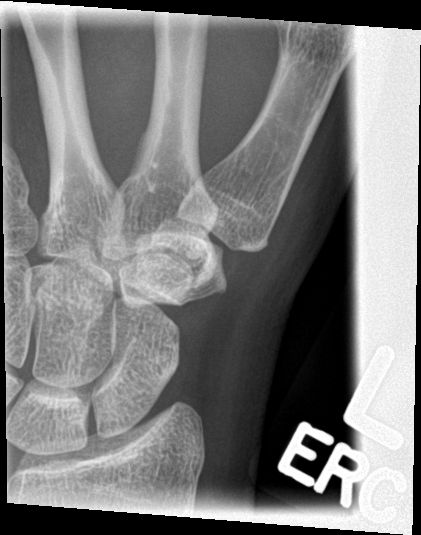

[4 of 4 positions shown; findings below may reference images not displayed]

FINDINGS: There is no evidence of fracture or dislocation. There is no
evidence of arthropathy or other focal bone abnormality. Soft
tissues are unremarkable.
IMPRESSION: Negative.

## 2019-12-21 ENCOUNTER — Emergency Department (HOSPITAL_COMMUNITY)
Admission: EM | Admit: 2019-12-21 | Discharge: 2019-12-22 | Discharge status: Against Medical Advice | Payer: BC Managed Care – PPO | Attending: Emergency Medicine | Admitting: Emergency Medicine

## 2019-12-21 ENCOUNTER — Encounter (HOSPITAL_COMMUNITY): Payer: Self-pay

## 2019-12-21 ENCOUNTER — Other Ambulatory Visit: Payer: Self-pay

## 2019-12-21 DIAGNOSIS — R109 Unspecified abdominal pain: Secondary | ICD-10-CM | POA: Insufficient documentation

## 2019-12-21 DIAGNOSIS — Z5321 Procedure and treatment not carried out due to patient leaving prior to being seen by health care provider: Secondary | ICD-10-CM | POA: Diagnosis not present

## 2019-12-21 LAB — URINALYSIS, ROUTINE W REFLEX MICROSCOPIC
Bacteria, UA: NONE SEEN
Bilirubin Urine: NEGATIVE
Glucose, UA: NEGATIVE mg/dL
Ketones, ur: NEGATIVE mg/dL
Leukocytes,Ua: NEGATIVE
Nitrite: NEGATIVE
Protein, ur: NEGATIVE mg/dL
Specific Gravity, Urine: 1.02 (ref 1.005–1.030)
pH: 5 (ref 5.0–8.0)

## 2019-12-21 LAB — COMPREHENSIVE METABOLIC PANEL
ALT: 28 U/L (ref 0–44)
AST: 18 U/L (ref 15–41)
Albumin: 3.9 g/dL (ref 3.5–5.0)
Alkaline Phosphatase: 61 U/L (ref 38–126)
Anion gap: 10 (ref 5–15)
BUN: 9 mg/dL (ref 6–20)
CO2: 25 mmol/L (ref 22–32)
Calcium: 9 mg/dL (ref 8.9–10.3)
Chloride: 103 mmol/L (ref 98–111)
Creatinine, Ser: 0.63 mg/dL (ref 0.44–1.00)
GFR calc Af Amer: >60 mL/min (ref >60)
GFR calc non Af Amer: >60 mL/min (ref >60)
Glucose, Bld: 100 mg/dL — ABNORMAL HIGH (ref 70–99)
Potassium: 3.7 mmol/L (ref 3.5–5.1)
Sodium: 138 mmol/L (ref 135–145)
Total Bilirubin: 0.2 mg/dL — ABNORMAL LOW (ref 0.3–1.2)
Total Protein: 7.9 g/dL (ref 6.5–8.1)

## 2019-12-21 LAB — CBC
HCT: 36.2 % (ref 36.0–46.0)
Hemoglobin: 11.3 g/dL — ABNORMAL LOW (ref 12.0–15.0)
MCH: 26.8 pg (ref 26.0–34.0)
MCHC: 31.2 g/dL (ref 30.0–36.0)
MCV: 86 fL (ref 80.0–100.0)
Platelets: 391 K/uL (ref 150–400)
RBC: 4.21 MIL/uL (ref 3.87–5.11)
RDW: 15.5 % (ref 11.5–15.5)
WBC: 9.1 K/uL (ref 4.0–10.5)
nRBC: 0 % (ref 0.0–0.2)

## 2019-12-21 LAB — I-STAT BETA HCG BLOOD, ED (MC, WL, AP ONLY): I-stat hCG, quantitative: <5 m[IU]/mL

## 2019-12-21 LAB — LIPASE, BLOOD: Lipase: 23 U/L (ref 11–51)

## 2019-12-21 NOTE — ED Triage Notes (Signed)
Pt came in POV with the c/o of RLQ abd pain. Pain stared this morning. Pt states "I think it is appendicitis"

## 2019-12-22 NOTE — ED Notes (Signed)
PATIENT LEFT 02:36 WITH BEING SEEN

## 2019-12-27 ENCOUNTER — Other Ambulatory Visit: Payer: Self-pay | Admitting: Physician Assistant

## 2019-12-27 DIAGNOSIS — R1031 Right lower quadrant pain: Secondary | ICD-10-CM

## 2020-01-13 ENCOUNTER — Emergency Department: Payer: BC Managed Care – PPO

## 2020-01-13 ENCOUNTER — Emergency Department
Admission: EM | Admit: 2020-01-13 | Discharge: 2020-01-13 | Disposition: A | Discharge status: Home / Self Care | Payer: BC Managed Care – PPO | Attending: Emergency Medicine | Admitting: Emergency Medicine

## 2020-01-13 ENCOUNTER — Encounter: Payer: Self-pay | Admitting: Emergency Medicine

## 2020-01-13 ENCOUNTER — Other Ambulatory Visit: Payer: Self-pay

## 2020-01-13 DIAGNOSIS — N201 Calculus of ureter: Secondary | ICD-10-CM | POA: Insufficient documentation

## 2020-01-13 DIAGNOSIS — Z79899 Other long term (current) drug therapy: Secondary | ICD-10-CM | POA: Diagnosis not present

## 2020-01-13 DIAGNOSIS — N200 Calculus of kidney: Secondary | ICD-10-CM

## 2020-01-13 DIAGNOSIS — R1031 Right lower quadrant pain: Secondary | ICD-10-CM | POA: Diagnosis present

## 2020-01-13 LAB — COMPREHENSIVE METABOLIC PANEL
ALT: 33 U/L (ref 0–44)
AST: 24 U/L (ref 15–41)
Albumin: 4.3 g/dL (ref 3.5–5.0)
Alkaline Phosphatase: 64 U/L (ref 38–126)
Anion gap: 9 (ref 5–15)
BUN: 10 mg/dL (ref 6–20)
CO2: 25 mmol/L (ref 22–32)
Calcium: 8.7 mg/dL — ABNORMAL LOW (ref 8.9–10.3)
Chloride: 104 mmol/L (ref 98–111)
Creatinine, Ser: 0.71 mg/dL (ref 0.44–1.00)
GFR calc Af Amer: >60 mL/min (ref >60)
GFR calc non Af Amer: >60 mL/min (ref >60)
Glucose, Bld: 108 mg/dL — ABNORMAL HIGH (ref 70–99)
Potassium: 3.5 mmol/L (ref 3.5–5.1)
Sodium: 138 mmol/L (ref 135–145)
Total Bilirubin: 0.4 mg/dL (ref 0.3–1.2)
Total Protein: 8.2 g/dL — ABNORMAL HIGH (ref 6.5–8.1)

## 2020-01-13 LAB — URINALYSIS, ROUTINE W REFLEX MICROSCOPIC
Bilirubin Urine: NEGATIVE
Glucose, UA: NEGATIVE mg/dL
Hgb urine dipstick: NEGATIVE
Ketones, ur: 5 mg/dL — AB
Leukocytes,Ua: NEGATIVE
Nitrite: NEGATIVE
Protein, ur: NEGATIVE mg/dL
Specific Gravity, Urine: 1.017 (ref 1.005–1.030)
pH: 5 (ref 5.0–8.0)

## 2020-01-13 LAB — CBC
HCT: 36.3 % (ref 36.0–46.0)
Hemoglobin: 11.7 g/dL — ABNORMAL LOW (ref 12.0–15.0)
MCH: 27.5 pg (ref 26.0–34.0)
MCHC: 32.2 g/dL (ref 30.0–36.0)
MCV: 85.2 fL (ref 80.0–100.0)
Platelets: 332 10*3/uL (ref 150–400)
RBC: 4.26 MIL/uL (ref 3.87–5.11)
RDW: 14.6 % (ref 11.5–15.5)
WBC: 8.7 10*3/uL (ref 4.0–10.5)
nRBC: 0 % (ref 0.0–0.2)

## 2020-01-13 LAB — POCT PREGNANCY, URINE: Preg Test, Ur: NEGATIVE

## 2020-01-13 MED ORDER — KETOROLAC TROMETHAMINE 60 MG/2ML IM SOLN
60.0000 mg | Freq: Once | INTRAMUSCULAR | Status: AC
  Administered 2020-01-13: 60 mg via INTRAMUSCULAR
  Filled 2020-01-13: qty 2

## 2020-01-13 MED ORDER — ONDANSETRON 4 MG PO TBDP: 4.0000 mg | ORAL_TABLET | Freq: Three times a day (TID) | ORAL | 0 refills | Status: DC | PRN

## 2020-01-13 MED ORDER — TAMSULOSIN HCL 0.4 MG PO CAPS: 0.4000 mg | ORAL_CAPSULE | Freq: Every day | ORAL | 0 refills | Status: DC

## 2020-01-13 MED ORDER — OXYCODONE-ACETAMINOPHEN 5-325 MG PO TABS: 1.0000 | ORAL_TABLET | ORAL | 0 refills | Status: DC | PRN

## 2020-01-13 NOTE — ED Provider Notes (Signed)
[facility] Emergency Department Provider Note  Time seen: 1:06 PM  I have reviewed the triage vital signs and the nursing notes.   HISTORY  Chief Complaint Abdominal Pain and Dysuria   HPI Brittany Nash is a 44 y.o. female with no significant past medical history presents to the emergency department for right lower quadrant abdominal pain.  According to the patient since this morning she has been experiencing a mild right lower quadrant abdominal pain that has progressively worsened now a moderate dull pain.  States some nausea but denies any vomiting or diarrhea.  Denies dysuria or hematuria.  No history of kidney stones.  Denies any fever.  Largely negative review of systems otherwise.   History reviewed. No pertinent past medical history.  There are no problems to display for this patient.   Past Surgical History:  Procedure Laterality Date  . CESAREAN SECTION     x4    Prior to Admission medications   Medication Sig Start Date End Date Taking? Authorizing Provider  ibuprofen (MOTRIN IB) 200 MG tablet Take 3 tablets (600 mg total) by mouth every 6 (six) hours as needed. 08/04/15   Jeanmarie Plant, MD    Allergies  Allergen Reactions  . Penicillins Rash    History reviewed. No pertinent family history.  Social History Social History   Tobacco Use  . Smoking status: Never Smoker  . Smokeless tobacco: Never Used  Substance Use Topics  . Alcohol use: No  . Drug use: No    Review of Systems Constitutional: Negative for fever. Cardiovascular: Negative for chest pain. Respiratory: Negative for shortness of breath. Gastrointestinal: Right lower quadrant abdominal pain Genitourinary: Negative for urinary compaints Musculoskeletal: Negative for musculoskeletal complaints Neurological: Negative for headache All other ROS negative  ____________________________________________   PHYSICAL EXAM:  VITAL SIGNS: ED Triage Vitals  Enc  Vitals Group     BP 01/13/20 1022 136/80     Pulse Rate 01/13/20 1022 77     Resp 01/13/20 1022 19     Temp 01/13/20 1022 98.3 F (36.8 C)     Temp Source 01/13/20 1022 Oral     SpO2 01/13/20 1022 100 %     Weight 01/13/20 1033 162 lb (73.5 kg)     Height 01/13/20 1033 5' (1.524 m)     Head Circumference --      Peak Flow --      Pain Score 01/13/20 1032 10     Pain Loc --      Pain Edu? --      Excl. in GC? --     Constitutional: Alert and oriented. Well appearing and in no distress. Eyes: Normal exam ENT      Head: Normocephalic and atraumatic.      Mouth/Throat: Mucous membranes are moist. Cardiovascular: Normal rate, regular rhythm. No murmur Respiratory: Normal respiratory effort without tachypnea nor retractions. Breath sounds are clear  Gastrointestinal: Soft, moderate right lower quadrant abdominal tenderness palpation.  No rebound guarding or distention otherwise benign abdomen. Musculoskeletal: Nontender with normal range of motion in all extremities.  Neurologic:  Normal speech and language. No gross focal neurologic deficits Skin:  Skin is warm, dry and intact.  Psychiatric: Mood and affect are normal.   ____________________________________________     RADIOLOGY  4 mm right UVJ stone.  ____________________________________________   INITIAL IMPRESSION / ASSESSMENT AND PLAN / ED COURSE  Pertinent labs & imaging results that were available during my care of the patient  were reviewed by me and considered in my medical decision making (see chart for details).   Patient presents to the emergency department for right lower quadrant abdominal pain starting this morning.  Has progressively worsened throughout the day.  Currently states is a moderate dull pain in the right lower quadrant.  States nausea but denies vomiting or diarrhea dysuria or hematuria.  Last menstrual period was 1 week ago.  Patient's labs are largely within normal limits.  Last p.o. intake was 5  AM.  We will obtain CT scan to rule out ureterolithiasis or appendicitis.  No history of ovarian cysts in the past.  CT scan shows 4 mm right UVJ stone.  Appendix is normal.  We will place the patient on pain medication, Flomax, urine culture has been sent.  We will have the patient follow-up with urology.  Patient agreeable to plan of care.  Discussed my normal kidney stone return precautions.  [name] was evaluated in Emergency Department on 01/13/2020 for the symptoms described in the history of present illness. She was evaluated in the context of the global COVID-19 pandemic, which necessitated consideration that the patient might be at risk for infection with the SARS-CoV-2 virus that causes COVID-19. Institutional protocols and algorithms that pertain to the evaluation of patients at risk for COVID-19 are in a state of rapid change based on information released by regulatory bodies including the CDC and federal and state organizations. These policies and algorithms were followed during the patient's care in the ED.  ____________________________________________   FINAL CLINICAL IMPRESSION(S) / ED DIAGNOSES  Right lower quadrant abdominal pain Kidney stone   Harvest Dark, MD 01/13/20 1400

## 2020-01-13 NOTE — ED Notes (Signed)
Pt transported for CT 

## 2020-01-13 NOTE — ED Triage Notes (Signed)
Pt to ER with c/o RLQ abdominal pain that started this AM.  Pt also c/o urinary frequency.  PT states similar symptoms approximately 1 month ago.

## 2020-01-15 LAB — URINE CULTURE: Culture: <10000 — AB

## 2022-07-03 ENCOUNTER — Other Ambulatory Visit: Payer: Self-pay | Admitting: Physician Assistant

## 2022-07-03 DIAGNOSIS — R1904 Left lower quadrant abdominal swelling, mass and lump: Secondary | ICD-10-CM

## 2022-07-04 ENCOUNTER — Other Ambulatory Visit: Payer: Self-pay | Admitting: Physician Assistant

## 2022-07-04 DIAGNOSIS — Z1231 Encounter for screening mammogram for malignant neoplasm of breast: Secondary | ICD-10-CM

## 2022-07-11 ENCOUNTER — Ambulatory Visit
Admission: RE | Admit: 2022-07-11 | Discharge: 2022-07-11 | Disposition: A | Discharge status: Home / Self Care | Payer: BC Managed Care – PPO | Source: Ambulatory Visit | Attending: Physician Assistant | Admitting: Physician Assistant

## 2022-07-11 DIAGNOSIS — R1904 Left lower quadrant abdominal swelling, mass and lump: Secondary | ICD-10-CM | POA: Diagnosis present

## 2023-01-03 ENCOUNTER — Ambulatory Visit
Admission: RE | Admit: 2023-01-03 | Discharge: 2023-01-03 | Disposition: A | Discharge status: Home / Self Care | Payer: BC Managed Care – PPO | Source: Ambulatory Visit | Attending: Physician Assistant | Admitting: Physician Assistant

## 2023-01-03 DIAGNOSIS — Z1231 Encounter for screening mammogram for malignant neoplasm of breast: Secondary | ICD-10-CM | POA: Diagnosis present

## 2023-01-16 ENCOUNTER — Other Ambulatory Visit: Payer: Self-pay | Admitting: Nurse Practitioner

## 2023-01-16 ENCOUNTER — Other Ambulatory Visit: Payer: Self-pay | Admitting: Family Medicine

## 2023-01-16 DIAGNOSIS — R928 Other abnormal and inconclusive findings on diagnostic imaging of breast: Secondary | ICD-10-CM

## 2023-01-16 DIAGNOSIS — N6489 Other specified disorders of breast: Secondary | ICD-10-CM

## 2023-01-27 ENCOUNTER — Ambulatory Visit
Admission: RE | Admit: 2023-01-27 | Discharge: 2023-01-27 | Disposition: A | Discharge status: Home / Self Care | Payer: BC Managed Care – PPO | Source: Ambulatory Visit | Attending: Nurse Practitioner | Admitting: Nurse Practitioner

## 2023-01-27 DIAGNOSIS — N6489 Other specified disorders of breast: Secondary | ICD-10-CM | POA: Insufficient documentation

## 2023-01-27 DIAGNOSIS — R928 Other abnormal and inconclusive findings on diagnostic imaging of breast: Secondary | ICD-10-CM | POA: Insufficient documentation

## 2023-07-08 ENCOUNTER — Encounter: Payer: Self-pay | Admitting: Nurse Practitioner

## 2023-07-11 ENCOUNTER — Other Ambulatory Visit: Payer: Self-pay | Admitting: Nurse Practitioner

## 2023-07-11 DIAGNOSIS — N63 Unspecified lump in unspecified breast: Secondary | ICD-10-CM

## 2023-11-05 ENCOUNTER — Other Ambulatory Visit: Payer: Self-pay | Admitting: Nurse Practitioner

## 2023-11-05 DIAGNOSIS — R1904 Left lower quadrant abdominal swelling, mass and lump: Secondary | ICD-10-CM

## 2023-11-14 ENCOUNTER — Ambulatory Visit
Admission: RE | Admit: 2023-11-14 | Discharge: 2023-11-14 | Disposition: A | Discharge status: Home / Self Care | Payer: BC Managed Care – PPO | Source: Ambulatory Visit | Attending: Nurse Practitioner | Admitting: Nurse Practitioner

## 2023-11-14 ENCOUNTER — Other Ambulatory Visit: Payer: Self-pay | Admitting: Nurse Practitioner

## 2023-11-14 DIAGNOSIS — R1904 Left lower quadrant abdominal swelling, mass and lump: Secondary | ICD-10-CM | POA: Insufficient documentation

## 2024-01-06 ENCOUNTER — Encounter: Payer: Self-pay | Admitting: Obstetrics

## 2024-01-06 ENCOUNTER — Ambulatory Visit: Admitting: Obstetrics

## 2024-01-06 VITALS — BP 102/64 | HR 81 | Ht 59.0 in | Wt 159.0 lb

## 2024-01-06 DIAGNOSIS — N920 Excessive and frequent menstruation with regular cycle: Secondary | ICD-10-CM | POA: Diagnosis not present

## 2024-01-06 DIAGNOSIS — R102 Pelvic and perineal pain: Secondary | ICD-10-CM | POA: Diagnosis not present

## 2024-01-06 DIAGNOSIS — N852 Hypertrophy of uterus: Secondary | ICD-10-CM | POA: Diagnosis not present

## 2024-01-06 DIAGNOSIS — D251 Intramural leiomyoma of uterus: Secondary | ICD-10-CM | POA: Diagnosis not present

## 2024-01-06 NOTE — Progress Notes (Signed)
 GYNECOLOGY PROGRESS NOTE  Subjective:  PCP: [facility]  Patient ID: Brittany Nash, female    DOB: 1976/04/16, 48 y.o.   MRN: [id number]  Patient declined interpreter, signed form.   HPI  Patient is a 48 y.o. [id number] female who presents for [facility] referring for menorrhagia and fibroids. Pelvic US obtained showing multiple fibroids. Pt states she feels a lot of pelvic pressure and can "feel the bumps" when pushing on her lower abdomen. Periods are regular, but sometimes very heavy with painful cramping. Has not previously been treated for fibroids.  Period Cycle (Days): 30 Period Duration (Days): 4-5 Period Pattern: Regular Menstrual Flow: Heavy, Light Menstrual Control: Tampon Menstrual Control Change Freq (Hours): Dysmenorrhea: (!) Severe Dysmenorrhea Symptoms: Cramping  OB History  Gravida Para Term Preterm AB Living  8 6 5 1 2 5   SAB IAB Ectopic Multiple Live Births          # Outcome Date GA Lbr Len/2nd Weight Sex Type Anes PTL Lv  8 Term           7 Term           6 Term           5 Term           4 Term           3 AB           2 AB           1 Preterm            History reviewed. No pertinent past medical history.  Patient Active Problem List   Diagnosis Date Noted   Intramural leiomyoma of uterus 01/06/2024   Menorrhagia with regular cycle 01/06/2024   Bulky or enlarged uterus 01/06/2024   Pelvic pressure in female 01/06/2024   Past Surgical History:  Procedure Laterality Date   CESAREAN SECTION     x4   History reviewed. No pertinent family history.  Social History   Socioeconomic History   Marital status: Legally Separated    Spouse name: Not on file   Number of children: Not on file   Years of education: Not on file   Highest education level: Not on file  Occupational History   Not on file  Tobacco Use   Smoking status: Never   Smokeless tobacco: Never  Vaping Use   Vaping  status: Never Used  Substance and Sexual Activity   Alcohol use: No   Drug use: No   Sexual activity: Yes    Birth control/protection: None  Other Topics Concern   Not on file  Social History Narrative   Not on file   Social Drivers of Health   Financial Resource Strain: Not on file  Food Insecurity: Not on file  Transportation Needs: Not on file  Physical Activity: Not on file  Stress: Not on file  Social Connections: Not on file  Intimate Partner Violence: Not on file   No current outpatient medications on file prior to visit.   No current facility-administered medications on file prior to visit.   Allergies  Allergen Reactions   Penicillins Rash   Review of Systems Pertinent items are noted in HPI.   Objective:   Blood pressure 102/64, pulse 81, height 4\' 11"  (1.499 m), weight 159 lb (72.1 kg), last menstrual period 12/29/2023. Body mass index is 32.11 kg/m.  General appearance: alert and cooperative Abdomen: soft, non-tender; bowel  sounds normal; no masses,  no organomegaly; 12wk uterus palpable through lower abdomen.  Pelvic: deferred Extremities: extremities normal, atraumatic, no cyanosis or edema Neurologic: Grossly normal  11/14/23 PELVIC US  FINDINGS: Transvaginal imaging is limited due to artifact from the distended urinary bladder.   The uterus is slightly retroverted in position and measures 12.6 x 8.3 x 4.9 cm. It demonstrates multiple fibroids with the largest, fundal intramural, measuring 2.8 x 2.5 cm. The endometrium measures 1.4 cm and demonstrates a normal homogeneous echotexture. Multiple nabothian cysts are noted.   The right ovary is only visualized transabdominally and measures 3.2 x 2.7 x 1.7 cm and demonstrates a normal echotexture. There is normal color Doppler flow.   The left ovary is only visualized transabdominally and measures 3.4 x 2.8 x 1.6 cm and demonstrates a normal echotexture. There is normal color Doppler flow.   There  is no fluid present within the cul-de-sac.   IMPRESSION: 1. Multiple uterine fibroids with the largest measuring 2.8 cm. The remainder of the pelvis appears unremarkable.  Assessment/Plan:   1. Intramural leiomyoma of uterus   2. Menorrhagia with regular cycle   3. Bulky or enlarged uterus   4. Pelvic pressure in female     48 y.o. [name] with a fibroid uterus, menorrhagia, and bulk symptoms. We reviewed her US  together and discussed etiology, pathology, symptoms and treatment of fibroids. Pt desiring UFE/UAE consultation, referral placed. Follow up with us  as needed.   Total time was 30 minutes. That includes chart review before the visit, the actual patient visit, and time spent on documentation after the visit. Time excludes procedures, if any.    Brittany Dunn, DO [facility]

## 2024-03-01 ENCOUNTER — Ambulatory Visit (INDEPENDENT_AMBULATORY_CARE_PROVIDER_SITE_OTHER): Payer: Self-pay | Admitting: Vascular Surgery

## 2024-03-01 ENCOUNTER — Encounter (INDEPENDENT_AMBULATORY_CARE_PROVIDER_SITE_OTHER): Payer: Self-pay | Admitting: Vascular Surgery

## 2024-03-01 VITALS — BP 120/82 | HR 72 | Resp 17 | Wt 161.8 lb

## 2024-03-01 DIAGNOSIS — N920 Excessive and frequent menstruation with regular cycle: Secondary | ICD-10-CM

## 2024-03-01 DIAGNOSIS — D251 Intramural leiomyoma of uterus: Secondary | ICD-10-CM

## 2024-03-03 ENCOUNTER — Encounter (INDEPENDENT_AMBULATORY_CARE_PROVIDER_SITE_OTHER): Payer: Self-pay | Admitting: Vascular Surgery

## 2024-03-03 NOTE — Progress Notes (Signed)
 MRN : [id number]  Brittany Nash is a 48 y.o. ([birth year]) female who presents with chief complaint of check circulation.  History of Present Illness:  I am asked to evaluate the patient by Dr. Dell Fennel.  She has been having increasing pelvic pain and GYN workup is demonstrated large fibroids within the uterus.  Duplex ultrasound dated November 14, 2023 demonstrates multiple fibroids with the largest measuring 2.8 x 2.5 cm  Location: Pelvis Character/quality of the symptom: Cramping aching pain Severity: She states it is extremely severe Duration: It seems to wax and wane but has been steadily worsening with time.   Timing/onset: Has been getting worse for the last several years Aggravating/context: None Relieving/modifying: None  No outpatient medications have been marked as taking for the 03/01/24 encounter (Office Visit) with Prescilla Brod, Ninette Basque, MD.    No past medical history on file.  Past Surgical History:  Procedure Laterality Date   CESAREAN SECTION     x4    Social History Social History   Tobacco Use   Smoking status: Never   Smokeless tobacco: Never  Vaping Use   Vaping status: Never Used  Substance Use Topics   Alcohol use: No   Drug use: No    Family History Family History  Problem Relation Age of Onset   Diabetes Mother     Allergies  Allergen Reactions   Penicillins Rash     REVIEW OF SYSTEMS (Negative unless checked)  Constitutional: [] Weight loss  [] Fever  [] Chills Cardiac: [] Chest pain   [] Chest pressure   [] Palpitations   [] Shortness of breath when laying flat   [] Shortness of breath with exertion. Vascular:  [x] Pain in legs with walking   [] Pain in legs at rest  [] History of DVT   [] Phlebitis   [] Swelling in legs   [] Varicose veins   [] Non-healing ulcers Pulmonary:   [] Uses home oxygen   [] Productive cough   [] Hemoptysis   [] Wheeze  [] COPD   [] Asthma Neurologic:   [] Dizziness   [] Seizures   [] History of stroke   [] History of TIA  [] Aphasia   [] Vissual changes   [] Weakness or numbness in arm   [] Weakness or numbness in leg Musculoskeletal:   [] Joint swelling   [] Joint pain   [] Low back pain Hematologic:  [] Easy bruising  [] Easy bleeding   [] Hypercoagulable state   [] Anemic Gastrointestinal:  [] Diarrhea   [] Vomiting  [] Gastroesophageal reflux/heartburn   [] Difficulty swallowing. Genitourinary:  [] Chronic kidney disease   [] Difficult urination  [] Frequent urination   [] Blood in urine Skin:  [] Rashes   [] Ulcers  Psychological:  [] History of anxiety   []  History of major depression.  Physical Examination  Vitals:   03/01/24 1000  BP: 120/82  Pulse: 72  Resp: 17  Weight: 161 lb 12.8 oz (73.4 kg)   Body mass index is 32.68 kg/m. Gen: WD/WN, NAD Head: [place]/AT, No temporalis wasting.  Ear/Nose/Throat: Hearing grossly intact, nares w/o erythema or drainage Eyes: PER, EOMI, sclera nonicteric.  Neck: Supple, no masses.  No bruit or JVD.  Pulmonary:  Good air movement, no audible wheezing, no use of accessory muscles.  Cardiac: RRR, normal S1, S2, no Murmurs. Vascular:  Vessel Right Left  Radial Palpable Palpable  Gastrointestinal: soft, non-distended. No guarding/no peritoneal signs.  Musculoskeletal: M/S 5/5 throughout.  No visible deformity.  Neurologic: CN 2-12 intact. Pain and light touch intact in extremities.  Symmetrical.  Speech is fluent. Motor exam as listed above. Psychiatric: Judgment intact, Mood & affect appropriate for pt's clinical situation. Dermatologic: No rashes or ulcers noted.  No changes consistent with cellulitis.   CBC Lab Results  Component Value Date   WBC 8.7 01/13/2020   HGB 11.7 (L) 01/13/2020   HCT 36.3 01/13/2020   MCV 85.2 01/13/2020   PLT 332 01/13/2020    BMET    Component Value Date/Time   NA 138 01/13/2020 1036   NA 136 11/21/2013 2344   K 3.5 01/13/2020 1036   K 3.1 (L) 11/21/2013 2344   CL 104  01/13/2020 1036   CL 101 11/21/2013 2344   CO2 25 01/13/2020 1036   CO2 31 11/21/2013 2344   GLUCOSE 108 (H) 01/13/2020 1036   GLUCOSE 89 11/21/2013 2344   BUN 10 01/13/2020 1036   BUN 11 11/21/2013 2344   CREATININE 0.71 01/13/2020 1036   CREATININE 0.57 (L) 11/21/2013 2344   CALCIUM 8.7 (L) 01/13/2020 1036   CALCIUM 9.1 11/21/2013 2344   GFRNONAA >60 01/13/2020 1036   GFRNONAA >60 11/21/2013 2344   GFRAA >60 01/13/2020 1036   GFRAA >60 11/21/2013 2344   CrCl cannot be calculated (Patient's most recent lab result is older than the maximum 21 days allowed.).  COAG No results found for: INR, PROTIME  Radiology No results found.   Assessment/Plan 1. Intramural leiomyoma of uterus (Primary) The patient has been evaluated by her GYN and has been offered hysterectomy versus angiography and embolization.  She would like to pursue embolization.  She does not wish to have any more children.  The risks and benefits were reviewed all questions were answered patient has agreed to proceed with embolization.  2. Menorrhagia with regular cycle See 1    Devon Fogo, MD  03/03/2024 8:14 AM

## 2024-03-12 ENCOUNTER — Telehealth (INDEPENDENT_AMBULATORY_CARE_PROVIDER_SITE_OTHER): Payer: Self-pay

## 2024-03-12 NOTE — Telephone Encounter (Addendum)
 I attempted to contact the patient regarding being scheduled for a UFE with Dr. Prescilla Brod. A message was left for a return call. Patient's translator called back and I explained that per the insurance it looks as if our office and the hospital is out of network. The translator stated the patient does have a card that pays for medical situations and will send this information through email for us  to work on.

## 2024-03-29 ENCOUNTER — Telehealth (INDEPENDENT_AMBULATORY_CARE_PROVIDER_SITE_OTHER): Payer: Self-pay

## 2024-03-29 NOTE — Telephone Encounter (Signed)
 Spoke with the patient's daughter in law as I had been speaking with her from the beginning per the patient. The patient has been scheduled with Dr. Jama for a UFE on 04/20/24 with a 12:30 pm arrival time to the [facility]. Pre-procedure instructions will be mailed. Prior shara was obtained.

## 2024-04-20 ENCOUNTER — Encounter
Admission: RE | Disposition: A | Discharge status: Home / Self Care | Payer: Self-pay | Source: Home / Self Care | Attending: Vascular Surgery

## 2024-04-20 ENCOUNTER — Ambulatory Visit
Admission: RE | Admit: 2024-04-20 | Discharge: 2024-04-20 | Disposition: A | Discharge status: Home / Self Care | Attending: Vascular Surgery | Admitting: Vascular Surgery

## 2024-04-20 DIAGNOSIS — N921 Excessive and frequent menstruation with irregular cycle: Secondary | ICD-10-CM

## 2024-04-20 DIAGNOSIS — N920 Excessive and frequent menstruation with regular cycle: Secondary | ICD-10-CM | POA: Diagnosis not present

## 2024-04-20 DIAGNOSIS — D251 Intramural leiomyoma of uterus: Secondary | ICD-10-CM | POA: Diagnosis present

## 2024-04-20 DIAGNOSIS — D259 Leiomyoma of uterus, unspecified: Secondary | ICD-10-CM

## 2024-04-20 HISTORY — PX: EMBOLIZATION (CATH LAB): CATH118239

## 2024-04-20 LAB — CREATININE, SERUM
Creatinine, Ser: 0.53 mg/dL (ref 0.44–1.00)
GFR, Estimated: >60 mL/min (ref >60)

## 2024-04-20 LAB — BUN: BUN: 10 mg/dL (ref 6–20)

## 2024-04-20 LAB — PREGNANCY, URINE: Preg Test, Ur: NEGATIVE

## 2024-04-20 SURGERY — EMBOLIZATION
Anesthesia: Moderate Sedation

## 2024-04-20 MED ORDER — IODIXANOL 320 MG/ML IV SOLN
INTRAVENOUS | Status: DC | PRN
  Administered 2024-04-20: 35 mL via INTRA_ARTERIAL

## 2024-04-20 MED ORDER — SODIUM CHLORIDE 0.9 % IV SOLN
8.0000 mg | Freq: Once | INTRAVENOUS | Status: AC
  Administered 2024-04-20: 8 mg via INTRAVENOUS
  Filled 2024-04-20: qty 8

## 2024-04-20 MED ORDER — HYDROMORPHONE HCL 1 MG/ML IJ SOLN: 0.5000 mg | INTRAMUSCULAR | Status: DC | PRN

## 2024-04-20 MED ORDER — OXYCODONE-ACETAMINOPHEN 5-325 MG PO TABS: 1.0000 | ORAL_TABLET | ORAL | 0 refills | Status: AC | PRN

## 2024-04-20 MED ORDER — SODIUM CHLORIDE 0.9% FLUSH: 3.0000 mL | Freq: Two times a day (BID) | INTRAVENOUS | Status: DC

## 2024-04-20 MED ORDER — SODIUM CHLORIDE 0.9 % IV SOLN: INTRAVENOUS | Status: DC

## 2024-04-20 MED ORDER — HEPARIN (PORCINE) IN NACL 1000-0.9 UT/500ML-% IV SOLN
INTRAVENOUS | Status: DC | PRN
Start: 2024-04-20 — End: 2024-04-21
  Administered 2024-04-20: 1000 mL

## 2024-04-20 MED ORDER — ONDANSETRON 4 MG PO TBDP
ORAL_TABLET | ORAL | Status: AC
Start: 2024-04-20 — End: 2024-04-20
  Filled 2024-04-20: qty 1

## 2024-04-20 MED ORDER — HEPARIN SODIUM (PORCINE) 1000 UNIT/ML IJ SOLN
INTRAMUSCULAR | Status: AC
  Filled 2024-04-20: qty 10

## 2024-04-20 MED ORDER — OXYCODONE HCL 5 MG PO TABS: 5.0000 mg | ORAL_TABLET | ORAL | Status: DC | PRN

## 2024-04-20 MED ORDER — HEPARIN SODIUM (PORCINE) 1000 UNIT/ML IJ SOLN
INTRAMUSCULAR | Status: DC | PRN
  Administered 2024-04-20: 2000 [IU] via INTRAVENOUS

## 2024-04-20 MED ORDER — DIPHENHYDRAMINE HCL 50 MG/ML IJ SOLN: 50.0000 mg | Freq: Once | INTRAMUSCULAR | Status: DC | PRN

## 2024-04-20 MED ORDER — FENTANYL CITRATE (PF) 100 MCG/2ML IJ SOLN
INTRAMUSCULAR | Status: DC | PRN
  Administered 2024-04-20: 50 ug via INTRAVENOUS

## 2024-04-20 MED ORDER — ONDANSETRON 4 MG PO TBDP
4.0000 mg | ORAL_TABLET | Freq: Once | ORAL | Status: AC
  Administered 2024-04-20: 4 mg via ORAL

## 2024-04-20 MED ORDER — MIDAZOLAM HCL 2 MG/ML PO SYRP: 8.0000 mg | ORAL_SOLUTION | Freq: Once | ORAL | Status: DC | PRN

## 2024-04-20 MED ORDER — LIDOCAINE HCL (PF) 1 % IJ SOLN
INTRAMUSCULAR | Status: DC | PRN
  Administered 2024-04-20: 10 mL via INTRADERMAL

## 2024-04-20 MED ORDER — VANCOMYCIN HCL IN DEXTROSE 1-5 GM/200ML-% IV SOLN
1000.0000 mg | INTRAVENOUS | Status: AC
  Administered 2024-04-20: 1000 mg via INTRAVENOUS
  Filled 2024-04-20: qty 200

## 2024-04-20 MED ORDER — ACETAMINOPHEN 325 MG PO TABS: 650.0000 mg | ORAL_TABLET | ORAL | Status: DC | PRN

## 2024-04-20 MED ORDER — ACETAMINOPHEN 10 MG/ML IV SOLN
1000.0000 mg | Freq: Once | INTRAVENOUS | Status: AC
  Administered 2024-04-20: 1000 mg via INTRAVENOUS
  Filled 2024-04-20: qty 100

## 2024-04-20 MED ORDER — KETOROLAC TROMETHAMINE 30 MG/ML IJ SOLN
30.0000 mg | INTRAMUSCULAR | Status: AC
  Administered 2024-04-20 (×2): 30 mg via INTRAVENOUS
  Filled 2024-04-20 (×2): qty 1

## 2024-04-20 MED ORDER — FENTANYL CITRATE (PF) 100 MCG/2ML IJ SOLN
INTRAMUSCULAR | Status: AC
  Filled 2024-04-20: qty 2

## 2024-04-20 MED ORDER — METHYLPREDNISOLONE SODIUM SUCC 125 MG IJ SOLR: 125.0000 mg | Freq: Once | INTRAMUSCULAR | Status: DC | PRN

## 2024-04-20 MED ORDER — MIDAZOLAM HCL 2 MG/2ML IJ SOLN
INTRAMUSCULAR | Status: DC | PRN
  Administered 2024-04-20: 2 mg via INTRAVENOUS

## 2024-04-20 MED ORDER — DEXAMETHASONE SODIUM PHOSPHATE 10 MG/ML IJ SOLN
8.0000 mg | Freq: Once | INTRAMUSCULAR | Status: AC
  Administered 2024-04-20: 8 mg via INTRAVENOUS
  Filled 2024-04-20: qty 0.8

## 2024-04-20 MED ORDER — SODIUM CHLORIDE 0.9 % IV SOLN: 250.0000 mL | INTRAVENOUS | Status: DC | PRN

## 2024-04-20 MED ORDER — ONDANSETRON HCL 4 MG/2ML IJ SOLN: 4.0000 mg | Freq: Four times a day (QID) | INTRAMUSCULAR | Status: DC | PRN

## 2024-04-20 MED ORDER — SODIUM CHLORIDE 0.9% FLUSH: 3.0000 mL | INTRAVENOUS | Status: DC | PRN

## 2024-04-20 MED ORDER — HYDROMORPHONE HCL 1 MG/ML IJ SOLN
1.0000 mg | Freq: Once | INTRAMUSCULAR | Status: AC | PRN
  Administered 2024-04-20: 1 mg via INTRAVENOUS
  Filled 2024-04-20: qty 1

## 2024-04-20 MED ORDER — MIDAZOLAM HCL 5 MG/5ML IJ SOLN
INTRAMUSCULAR | Status: AC
  Filled 2024-04-20: qty 5

## 2024-04-20 MED ORDER — SODIUM CHLORIDE 0.9 % IV BOLUS
INTRAVENOUS | Status: DC | PRN
  Administered 2024-04-20: 500 mL via INTRAVENOUS

## 2024-04-20 MED ORDER — FAMOTIDINE 20 MG PO TABS: 40.0000 mg | ORAL_TABLET | Freq: Once | ORAL | Status: DC | PRN

## 2024-04-20 SURGICAL SUPPLY — 18 items
CATH BEACON 5 .035 65 RIM TIP (CATHETERS) IMPLANT
CATH MICROCATH PRGRT 2.8F 110 (CATHETERS) IMPLANT
COIL 400 COMPLEX SOFT 4X15CM (Vascular Products) IMPLANT
COIL 400 COMPLEX SOFT 6X20CM (Vascular Products) IMPLANT
COIL 400 COMPLEX SOFT 6X30CM (Vascular Products) IMPLANT
COVER PROBE ULTRASOUND 5X96 (MISCELLANEOUS) IMPLANT
DEVICE TORQUE (MISCELLANEOUS) IMPLANT
DEVICE VASC CLSR CELT ART 6 (Vascular Products) IMPLANT
GUIDEWIRE ANGLED .035 180CM (WIRE) IMPLANT
HANDLE DETACHMENT COIL (MISCELLANEOUS) IMPLANT
MICROSPHERE 600+75UM (Embolic) IMPLANT
NDL ENTRY 21GA 7CM ECHOTIP (NEEDLE) IMPLANT
NEEDLE ENTRY 21GA 7CM ECHOTIP (NEEDLE) ×1 IMPLANT
PACK ANGIOGRAPHY (CUSTOM PROCEDURE TRAY) ×1 IMPLANT
SET INTRO CAPELLA COAXIAL (SET/KITS/TRAYS/PACK) IMPLANT
SHEATH BRITE TIP 6FRX11 (SHEATH) IMPLANT
SUT MNCRL AB 4-0 PS2 18 (SUTURE) IMPLANT
WIRE J 3MM .035X145CM (WIRE) IMPLANT

## 2024-04-20 NOTE — H&P (View-Only) (Signed)
 MRN : [id number]  Brittany Nash is a 48 y.o. (07-29-1976) female who presents with chief complaint of check circulation.  History of Present Illness:   The patient presents to [facility] for treatment of her uterine fibroids.  She was last seen in the office March 01, 2024.  She has been having increasing pelvic pain and GYN workup is demonstrated large fibroids within the uterus.   Duplex ultrasound dated November 14, 2023 demonstrates multiple fibroids with the largest measuring 2.8 x 2.5 cm  Location: Pelvis Character/quality of the symptom: Cramping aching pain Severity: She states it is extremely severe Duration: It seems to wax and wane but has been steadily worsening with time.   Timing/onset: Has been getting worse for the last several years Aggravating/context: None Relieving/modifying: None  No outpatient medications have been marked as taking for the 04/20/24 encounter [facility] Encounter).    No past medical history on file.  Past Surgical History:  Procedure Laterality Date   CESAREAN SECTION     x4    Social History Social History   Tobacco Use   Smoking status: Never   Smokeless tobacco: Never  Vaping Use   Vaping status: Never Used  Substance Use Topics   Alcohol use: No   Drug use: No    Family History Family History  Problem Relation Age of Onset   Diabetes Mother     Allergies  Allergen Reactions   Penicillins Rash     REVIEW OF SYSTEMS (Negative unless checked)  Constitutional: [] Weight loss  [] Fever  [] Chills Cardiac: [] Chest pain   [] Chest pressure   [] Palpitations   [] Shortness of breath when laying flat   [] Shortness of breath with exertion. Vascular:  [x] Pain in legs with walking   [] Pain in legs at rest  [] History of DVT   [] Phlebitis   [] Swelling in legs   [] Varicose veins   [] Non-healing ulcers Pulmonary:   [] Uses home oxygen    [] Productive cough   [] Hemoptysis   [] Wheeze  [] COPD   [] Asthma Neurologic:  [] Dizziness   [] Seizures   [] History of stroke   [] History of TIA  [] Aphasia   [] Vissual changes   [] Weakness or numbness in arm   [] Weakness or numbness in leg Musculoskeletal:   [] Joint swelling   [] Joint pain   [] Low back pain Hematologic:  [] Easy bruising  [] Easy bleeding   [] Hypercoagulable state   [] Anemic Gastrointestinal:  [] Diarrhea   [] Vomiting  [] Gastroesophageal reflux/heartburn   [] Difficulty swallowing. Genitourinary:  [] Chronic kidney disease   [] Difficult urination  [] Frequent urination   [] Blood in urine Skin:  [] Rashes   [] Ulcers  Psychological:  [] History of anxiety   []  History of major depression.  Physical Examination  There were no vitals filed for this visit. There is no height or weight on file to calculate BMI. Gen: WD/WN, NAD Head: /AT, No temporalis wasting.  Ear/Nose/Throat: Hearing grossly intact, nares w/o erythema or drainage Eyes: PER, EOMI, sclera nonicteric.  Neck: Supple, no masses.  No bruit or JVD.  Pulmonary:  Good air movement, no audible wheezing, no use of accessory  muscles.  Cardiac: RRR, normal S1, S2, no Murmurs. Vascular:  mild trophic changes, no open wounds Vessel Right Left  Radial Palpable Palpable  Gastrointestinal: soft, non-distended. No guarding/no peritoneal signs.  Musculoskeletal: M/S 5/5 throughout.  No visible deformity.  Neurologic: CN 2-12 intact. Pain and light touch intact in extremities.  Symmetrical.  Speech is fluent. Motor exam as listed above. Psychiatric: Judgment intact, Mood & affect appropriate for pt's clinical situation. Dermatologic: No rashes or ulcers noted.  No changes consistent with cellulitis.   CBC Lab Results  Component Value Date   WBC 8.7 01/13/2020   HGB 11.7 (L) 01/13/2020   HCT 36.3 01/13/2020   MCV 85.2 01/13/2020   PLT 332 01/13/2020    BMET    Component Value Date/Time   NA 138 01/13/2020 1036   NA 136  11/21/2013 2344   K 3.5 01/13/2020 1036   K 3.1 (L) 11/21/2013 2344   CL 104 01/13/2020 1036   CL 101 11/21/2013 2344   CO2 25 01/13/2020 1036   CO2 31 11/21/2013 2344   GLUCOSE 108 (H) 01/13/2020 1036   GLUCOSE 89 11/21/2013 2344   BUN 10 01/13/2020 1036   BUN 11 11/21/2013 2344   CREATININE 0.71 01/13/2020 1036   CREATININE 0.57 (L) 11/21/2013 2344   CALCIUM 8.7 (L) 01/13/2020 1036   CALCIUM 9.1 11/21/2013 2344   GFRNONAA >60 01/13/2020 1036   GFRNONAA >60 11/21/2013 2344   GFRAA >60 01/13/2020 1036   GFRAA >60 11/21/2013 2344   CrCl cannot be calculated (Patient's most recent lab result is older than the maximum 21 days allowed.).  COAG No results found for: INR, PROTIME  Radiology No results found.   Assessment/Plan 1. Intramural leiomyoma of uterus (Primary) The patient has been evaluated by her GYN and has been offered hysterectomy versus angiography and embolization.  She would like to pursue embolization.  She does not wish to have any more children.  The risks and benefits were reviewed all questions were answered patient has agreed to proceed with embolization.   2. Menorrhagia with regular cycle See 1   Cordella Shawl, MD  04/20/2024 11:41 AM

## 2024-04-20 NOTE — Progress Notes (Signed)
 MRN : [id number]  Brittany Nash is a 48 y.o. (07-29-1976) female who presents with chief complaint of check circulation.  History of Present Illness:   The patient presents to [facility] for treatment of her uterine fibroids.  She was last seen in the office March 01, 2024.  She has been having increasing pelvic pain and GYN workup is demonstrated large fibroids within the uterus.   Duplex ultrasound dated November 14, 2023 demonstrates multiple fibroids with the largest measuring 2.8 x 2.5 cm  Location: Pelvis Character/quality of the symptom: Cramping aching pain Severity: She states it is extremely severe Duration: It seems to wax and wane but has been steadily worsening with time.   Timing/onset: Has been getting worse for the last several years Aggravating/context: None Relieving/modifying: None  No outpatient medications have been marked as taking for the 04/20/24 encounter [facility] Encounter).    No past medical history on file.  Past Surgical History:  Procedure Laterality Date   CESAREAN SECTION     x4    Social History Social History   Tobacco Use   Smoking status: Never   Smokeless tobacco: Never  Vaping Use   Vaping status: Never Used  Substance Use Topics   Alcohol use: No   Drug use: No    Family History Family History  Problem Relation Age of Onset   Diabetes Mother     Allergies  Allergen Reactions   Penicillins Rash     REVIEW OF SYSTEMS (Negative unless checked)  Constitutional: [] Weight loss  [] Fever  [] Chills Cardiac: [] Chest pain   [] Chest pressure   [] Palpitations   [] Shortness of breath when laying flat   [] Shortness of breath with exertion. Vascular:  [x] Pain in legs with walking   [] Pain in legs at rest  [] History of DVT   [] Phlebitis   [] Swelling in legs   [] Varicose veins   [] Non-healing ulcers Pulmonary:   [] Uses home oxygen    [] Productive cough   [] Hemoptysis   [] Wheeze  [] COPD   [] Asthma Neurologic:  [] Dizziness   [] Seizures   [] History of stroke   [] History of TIA  [] Aphasia   [] Vissual changes   [] Weakness or numbness in arm   [] Weakness or numbness in leg Musculoskeletal:   [] Joint swelling   [] Joint pain   [] Low back pain Hematologic:  [] Easy bruising  [] Easy bleeding   [] Hypercoagulable state   [] Anemic Gastrointestinal:  [] Diarrhea   [] Vomiting  [] Gastroesophageal reflux/heartburn   [] Difficulty swallowing. Genitourinary:  [] Chronic kidney disease   [] Difficult urination  [] Frequent urination   [] Blood in urine Skin:  [] Rashes   [] Ulcers  Psychological:  [] History of anxiety   []  History of major depression.  Physical Examination  There were no vitals filed for this visit. There is no height or weight on file to calculate BMI. Gen: WD/WN, NAD Head: /AT, No temporalis wasting.  Ear/Nose/Throat: Hearing grossly intact, nares w/o erythema or drainage Eyes: PER, EOMI, sclera nonicteric.  Neck: Supple, no masses.  No bruit or JVD.  Pulmonary:  Good air movement, no audible wheezing, no use of accessory  muscles.  Cardiac: RRR, normal S1, S2, no Murmurs. Vascular:  mild trophic changes, no open wounds Vessel Right Left  Radial Palpable Palpable  Gastrointestinal: soft, non-distended. No guarding/no peritoneal signs.  Musculoskeletal: M/S 5/5 throughout.  No visible deformity.  Neurologic: CN 2-12 intact. Pain and light touch intact in extremities.  Symmetrical.  Speech is fluent. Motor exam as listed above. Psychiatric: Judgment intact, Mood & affect appropriate for pt's clinical situation. Dermatologic: No rashes or ulcers noted.  No changes consistent with cellulitis.   CBC Lab Results  Component Value Date   WBC 8.7 01/13/2020   HGB 11.7 (L) 01/13/2020   HCT 36.3 01/13/2020   MCV 85.2 01/13/2020   PLT 332 01/13/2020    BMET    Component Value Date/Time   NA 138 01/13/2020 1036   NA 136  11/21/2013 2344   K 3.5 01/13/2020 1036   K 3.1 (L) 11/21/2013 2344   CL 104 01/13/2020 1036   CL 101 11/21/2013 2344   CO2 25 01/13/2020 1036   CO2 31 11/21/2013 2344   GLUCOSE 108 (H) 01/13/2020 1036   GLUCOSE 89 11/21/2013 2344   BUN 10 01/13/2020 1036   BUN 11 11/21/2013 2344   CREATININE 0.71 01/13/2020 1036   CREATININE 0.57 (L) 11/21/2013 2344   CALCIUM 8.7 (L) 01/13/2020 1036   CALCIUM 9.1 11/21/2013 2344   GFRNONAA >60 01/13/2020 1036   GFRNONAA >60 11/21/2013 2344   GFRAA >60 01/13/2020 1036   GFRAA >60 11/21/2013 2344   CrCl cannot be calculated (Patient's most recent lab result is older than the maximum 21 days allowed.).  COAG No results found for: INR, PROTIME  Radiology No results found.   Assessment/Plan 1. Intramural leiomyoma of uterus (Primary) The patient has been evaluated by her GYN and has been offered hysterectomy versus angiography and embolization.  She would like to pursue embolization.  She does not wish to have any more children.  The risks and benefits were reviewed all questions were answered patient has agreed to proceed with embolization.   2. Menorrhagia with regular cycle See 1   Cordella Shawl, MD  04/20/2024 11:41 AM

## 2024-04-20 NOTE — Op Note (Signed)
 [facility]  Percutaneous Study/Intervention Procedural Note     Date of Surgery:04/20/2024   Surgeon:Cylah Fannin, Brittany Nash    Pre-operative Diagnosis:  symptomatic painful uterine fibroids associated with excessive bleeding   Post-operative diagnosis:  Same   Procedure(s) Performed:             1.  Introduction catheter right uterine artery third order catheter placement             2.  Introduction catheter left uterine artery third order catheter placement             3.  Embolization right uterine artery with 1.5 cc of medium PVC beads with coil embolization using Ruby coils             4.  Embolization of the left uterine artery 1.5 cc of medium PVC beads with coil immobilization using Ruby coils             5.  Ultrasound-guided access to the right common femoral artery             6. StarClose right common femoral artery.                Anesthesia: Conscious sedation was administered by the interventional radiology RN under my direct supervision. IV Versed  plus fentanyl  were utilized. Continuous ECG, pulse oximetry and blood pressure was monitored throughout the entire procedure. Conscious sedation was administered for a total of 66 minutes.   Sheath: 5 French 11 cm Pinnacle sheath right common femoral artery retrograde   Contrast: 35 cc    Fluoroscopy Time: 10.1 minutes   Indications: Patient presented to the office with complaints of severe pelvic pain associated with large uterine fibroids and menometrorrhagia.  She does not wish to move forward with hysterectomy at this time and in fact requested uterine fibroid embolization.  She is a good candidate for intervention and embolization was recommended.  Risks and benefits were reviewed all questions were answered patient has agreed to proceed.   Procedure:  Brittany Nash a 48 y.o. female who was identified and appropriate procedural time out was performed.  The patient was then placed supine on the  table and prepped and draped in the usual sterile fashion.  Ultrasound was used to evaluate the right common femoral artery.  It was echolucent and pulsatile indicating it is patent .  An ultrasound image was acquired for the permanent record.  A micropuncture needle was used to access the right common femoral artery under direct ultrasound guidance.  The microwire was then advanced under fluoroscopic guidance without difficulty followed by the micro-sheath.  A 0.035 J wire was advanced without resistance and a 5Fr sheath was placed.     Pigtail catheter was advanced into the abdominal aorta just above the bifurcation and AP projection of the iliac artery system was obtained.  The pigtail catheter was exchanged for a rim catheter and a floppy wire.  The rim was then used to select the origin of the right internal carotid artery.  Hand-injection of contrast was verified and positioned in the right proximal internal iliac artery floppy.  A Progreat catheter was then advanced out into the more distal internal iliac and then selecting the uterine artery.  Hand-injection contrast was then performed create a magnified image.  A Progreat catheter was then advanced more distally into the uterine artery.  1.5 cc of medium (approximately 600 m in diameter) PVC beads are then instilled.  Follow-up imaging demonstrates  the larger vessels remain patent but there is a significant reduction in distal flow as desired.  I then placed 3 Ruby coils on the right beginning with a 4 mm x 15 cm soft and then placing a second 4 mm x 15 cm soft coil followed by a 6 mm x 20 cm soft coil.  Follow-up imaging demonstrated minimal flow distally.    With successful embolization of the right uterine artery attention is now turned to treating the left uterine artery.  The right catheter is removed from the right over the floppy Glidewire and was then advanced into the aorta and the rim catheter is advanced over the Glidewire the aortic  bifurcation is hooked and the Glidewire negotiated into the left internal iliac artery.  A rim was then advanced and used to select the left internal iliac artery.  Several different views were then obtained and the Progreat was used to select the left uterine artery.   The Progreat catheter was then advanced out into the more distal uterine artery.  Hand-injection of contrast verified its location and subsequently 1.5 cc of medium PVC beads was instilled.  Follow-up imaging demonstrated marked decrease in the distal flow with preservation of the larger vessels.  I then placed 3 Ruby coils in the left uterine artery beginning with a 4 mm x 15 cm soft coil followed by two 6 mm x 30 cm soft coils.  Follow-up imaging demonstrated minimal distal flow.  At this point I felt we had achieved an excellent result given her desire to potentially have another child coils were not utilized.  I elected to terminate the case.   Findings:              Aortogram: Distal aorta is widely patent.             Right internal iliac and uterine artery: Widely patent with standard anatomy.  Following embolization that there is a marked decrease in the distal flow within the uterine artery on the larger artery still fill consistent with good embolization             Left Lower Extremity:  Widely patent with standard anatomy.  Following embolization that there is a marked decrease in the distal flow within the uterine artery on the larger artery still fill consistent with good embolization     Disposition: Patient was taken to the recovery room in stable condition having tolerated the procedure well.   Brittany JUDITHANN Shawl, Brittany Nash 04/20/2024,  5:05 pm

## 2024-04-20 NOTE — Interval H&P Note (Signed)
 History and Physical Interval Note:  04/20/2024 11:42 AM  Brittany Nash  has presented today for surgery, with the diagnosis of Uterine fibroid embolization   Spanish Interpreter Requested  Intramural leiomyoma of uterus.  The various methods of treatment have been discussed with the patient and family. After consideration of risks, benefits and other options for treatment, the patient has consented to  Procedure(s): EMBOLIZATION (N/A) as a surgical intervention.  The patient's history has been reviewed, patient examined, no change in status, stable for surgery.  I have reviewed the patient's chart and labs.  Questions were answered to the patient's satisfaction.     Cordella Shawl

## 2024-04-21 ENCOUNTER — Encounter: Payer: Self-pay | Admitting: Vascular Surgery

## 2024-04-23 ENCOUNTER — Encounter: Payer: Self-pay | Admitting: Vascular Surgery
